# Patient Record
Sex: Female | Born: 1960 | Race: White | Hispanic: No | Smoking: Former smoker
Health system: Southern US, Community
[De-identification: ages and names within clinical notes are randomized; demographics above are authoritative.]

## PROBLEM LIST (undated history)

## (undated) DIAGNOSIS — F32A Depression, unspecified: Secondary | ICD-10-CM

## (undated) DIAGNOSIS — K219 Gastro-esophageal reflux disease without esophagitis: Secondary | ICD-10-CM

## (undated) DIAGNOSIS — F329 Major depressive disorder, single episode, unspecified: Secondary | ICD-10-CM

## (undated) DIAGNOSIS — M51369 Other intervertebral disc degeneration, lumbar region without mention of lumbar back pain or lower extremity pain: Secondary | ICD-10-CM

## (undated) DIAGNOSIS — Z780 Asymptomatic menopausal state: Secondary | ICD-10-CM

## (undated) DIAGNOSIS — M8580 Other specified disorders of bone density and structure, unspecified site: Secondary | ICD-10-CM

## (undated) DIAGNOSIS — E039 Hypothyroidism, unspecified: Secondary | ICD-10-CM

## (undated) DIAGNOSIS — M5136 Other intervertebral disc degeneration, lumbar region: Secondary | ICD-10-CM

## (undated) DIAGNOSIS — D649 Anemia, unspecified: Secondary | ICD-10-CM

## (undated) DIAGNOSIS — T7840XA Allergy, unspecified, initial encounter: Secondary | ICD-10-CM

## (undated) DIAGNOSIS — F419 Anxiety disorder, unspecified: Secondary | ICD-10-CM

## (undated) HISTORY — DX: Other intervertebral disc degeneration, lumbar region without mention of lumbar back pain or lower extremity pain: M51.369

## (undated) HISTORY — DX: Major depressive disorder, single episode, unspecified: F32.9

## (undated) HISTORY — DX: Anemia, unspecified: D64.9

## (undated) HISTORY — DX: Anxiety disorder, unspecified: F41.9

## (undated) HISTORY — DX: Asymptomatic menopausal state: Z78.0

## (undated) HISTORY — DX: Gastro-esophageal reflux disease without esophagitis: K21.9

## (undated) HISTORY — DX: Hypothyroidism, unspecified: E03.9

## (undated) HISTORY — DX: Allergy, unspecified, initial encounter: T78.40XA

## (undated) HISTORY — DX: Other intervertebral disc degeneration, lumbar region: M51.36

## (undated) HISTORY — DX: Other specified disorders of bone density and structure, unspecified site: M85.80

## (undated) HISTORY — DX: Depression, unspecified: F32.A

## (undated) HISTORY — PX: BACK SURGERY: SHX140

## (undated) HISTORY — PX: ABDOMINAL HYSTERECTOMY: SHX81

---

## 1998-03-16 ENCOUNTER — Other Ambulatory Visit: Admission: RE | Admit: 1998-03-16 | Discharge: 1998-03-16 | Payer: Self-pay | Admitting: Obstetrics and Gynecology

## 1999-03-06 ENCOUNTER — Encounter: Admission: RE | Admit: 1999-03-06 | Discharge: 1999-03-08 | Payer: Self-pay

## 1999-03-28 ENCOUNTER — Other Ambulatory Visit: Admission: RE | Admit: 1999-03-28 | Discharge: 1999-03-28 | Payer: Self-pay | Admitting: Obstetrics and Gynecology

## 1999-07-21 ENCOUNTER — Ambulatory Visit (HOSPITAL_COMMUNITY): Admission: RE | Admit: 1999-07-21 | Discharge: 1999-07-21 | Payer: Self-pay | Admitting: Gastroenterology

## 2000-04-24 ENCOUNTER — Other Ambulatory Visit: Admission: RE | Admit: 2000-04-24 | Discharge: 2000-04-24 | Payer: Self-pay | Admitting: Family Medicine

## 2001-05-14 ENCOUNTER — Other Ambulatory Visit: Admission: RE | Admit: 2001-05-14 | Discharge: 2001-05-14 | Payer: Self-pay | Admitting: Family Medicine

## 2001-06-05 ENCOUNTER — Ambulatory Visit (HOSPITAL_BASED_OUTPATIENT_CLINIC_OR_DEPARTMENT_OTHER): Admission: RE | Admit: 2001-06-05 | Discharge: 2001-06-05 | Payer: Self-pay | Admitting: *Deleted

## 2001-12-23 ENCOUNTER — Encounter (INDEPENDENT_AMBULATORY_CARE_PROVIDER_SITE_OTHER): Payer: Self-pay | Admitting: Specialist

## 2001-12-23 ENCOUNTER — Inpatient Hospital Stay (HOSPITAL_COMMUNITY): Admission: RE | Admit: 2001-12-23 | Discharge: 2001-12-26 | Payer: Self-pay | Admitting: Obstetrics and Gynecology

## 2004-01-04 ENCOUNTER — Other Ambulatory Visit: Admission: RE | Admit: 2004-01-04 | Discharge: 2004-01-04 | Payer: Self-pay | Admitting: Obstetrics and Gynecology

## 2004-03-21 ENCOUNTER — Encounter: Admission: RE | Admit: 2004-03-21 | Discharge: 2004-03-21 | Payer: Self-pay | Admitting: Orthopedic Surgery

## 2006-06-24 ENCOUNTER — Ambulatory Visit (HOSPITAL_COMMUNITY): Admission: RE | Admit: 2006-06-24 | Discharge: 2006-06-25 | Payer: Self-pay | Admitting: Orthopedic Surgery

## 2006-09-14 ENCOUNTER — Emergency Department (HOSPITAL_COMMUNITY): Admission: EM | Admit: 2006-09-14 | Discharge: 2006-09-14 | Payer: Self-pay | Admitting: Emergency Medicine

## 2006-10-17 ENCOUNTER — Ambulatory Visit (HOSPITAL_COMMUNITY): Admission: RE | Admit: 2006-10-17 | Discharge: 2006-10-18 | Payer: Self-pay | Admitting: Orthopedic Surgery

## 2007-08-12 ENCOUNTER — Inpatient Hospital Stay (HOSPITAL_COMMUNITY): Admission: RE | Admit: 2007-08-12 | Discharge: 2007-08-16 | Payer: Self-pay | Admitting: Neurosurgery

## 2011-01-02 NOTE — Discharge Summary (Signed)
NAMEAVIGAYIL, TON               ACCOUNT NO.:  [id number]   MEDICAL RECORD NO.:  [id number]          PATIENT TYPE:  INP   LOCATION:  3007                         FACILITY:  [facility]   PHYSICIAN:  Coletta Memos, M.D.     DATE OF BIRTH:  [birth year]   DATE OF ADMISSION:  08/12/2007  DATE OF DISCHARGE:  08/16/2007                               DISCHARGE SUMMARY   ADMITTING DIAGNOSES:  1. 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  2. Spondylosis L5-S1.   DISCHARGE DIAGNOSES:  1. 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  2. Spondylosis L5-S1.   PROCEDURE:  L4-5, L5-S1 laminectomy, re-do diskectomy, posterior lumbar  interbody fusion with Ray threaded cages and segmental pedicle screw  fixation and posterolateral arthrodesis performed August 12, 2007.   POSTOPERATIVE COURSE:  Has been uneventful.  Ms. Larzelere is up, has been  ambulating, has been voiding and tolerating a regular diet.  Her wound  is clean, dry and there are no signs of infection.  She will be  discharged home with a rolling walker 5 inch wheels and a 3 in 1.  These  needs have been taken care of.  She will go home today with a  prescription for Neurontin.  She will take Demerol as she has had  previously prescribed at home along with Flexeril.  She knows to call  Dr. Temple Pacini secretary, Cletis Media, for return appointment.  She was  given instructions per Dr. Temple Pacini printed sheet for discharge.  No  driving for 3-4 weeks.           ______________________________  Coletta Memos, M.D.     KC/MEDQ  D:  08/16/2007  T:  08/17/2007  Job:  [id number]

## 2011-01-02 NOTE — H&P (Signed)
NAME:  PARNEET, GLANTZ NO.:  [id number]   MEDICAL RECORD NO.:  [id number]          PATIENT TYPE:  INP   LOCATION:  3172                         FACILITY:  [facility]   PHYSICIAN:  Payton Doughty, M.D.      DATE OF BIRTH:  11-11-1960   DATE OF ADMISSION:  08/12/2007  DATE OF DISCHARGE:                              HISTORY & PHYSICAL   ADMISSION DIAGNOSES:  1. Recurrent disk L4-5.  2. Spondylosis L5-S1.   HISTORY OF PRESENT ILLNESS:  A very nice 50 year old left-handed white  woman who has had two diskectomies in the past year, one in November of  2007 and one in February of 2008.  She has increased pain in her left  leg.  MR shows a disk recurrence at L4-5.  Her operations were done by a  different doctor.  She states the pain in her back gets down to her left  leg.  She has undergone conservative management with no improvement and  now presents for a fusion.   PAST MEDICAL HISTORY:  Benign.   MEDICATIONS:  Zoloft, Elavil, Premarin, TriCor, Flexeril, Mobic,  hydrocodone, Aciphex, and vitamin D.   ALLERGIES:  SULFA, CODEINE.   SOCIAL HISTORY:  She does not smoke or drink.  She is a prescription  Development worker, international aid at [employer].   FAMILY HISTORY:  Her mother is 56 in good health with hypertension.  Her  father is 42 in fair health with diabetes and arthritis.   REVIEW OF SYSTEMS:  Remarkable for glasses, sinus problems, sinus  headache, leg pain, gastritis, abdominal pain, leg weakness, back pain,  and depression.   PHYSICAL EXAMINATION:  HEENT:  Within normal limits.  She has reasonable  range of motion in her neck.  CHEST:  Clear.  HEART:  Regular rate and rhythm.  ABDOMEN:  Nontender with no hepatosplenomegaly.  EXTREMITIES:  Without clubbing or cyanosis.  Peripheral pulses are good.  GENITOURINARY:  Deferred.  NEUROLOGY:  She is awake, alert and oriented.  Cranial nerves II-XII  grossly intact.  Motor examination shows 5/5  strength throughout the  upper and lower extremities.  No current sensory deficit.  Reflexes are  2 at the knees and 2 at the ankles.  Toes are downgoing bilaterally.  Straight leg raising is mildly positive on the left.   MR shows spondylosis and recurrent disk at L4-5.   IMPRESSION:  Disk recurrence and spondylosis with intractable back and  leg pain.   PLAN:  Lumbar laminectomy and diskectomy, posterior lumbar interbody  fusion with Ray threaded fusion cages, segmental pedicle screw fixation  with pedicle screws and posterolateral arthrodesis.  The risks and  benefits have been discussed with her and she wishes to proceed.           ______________________________  Payton Doughty, M.D.     MWR/MEDQ  D:  08/12/2007  T:  08/12/2007  Job:  [id number]

## 2011-01-02 NOTE — Op Note (Signed)
NAME:  Kelly Conley, Kelly Conley               ACCOUNT NO.:  [id number]   MEDICAL RECORD NO.:  [id number]          PATIENT TYPE:  INP   LOCATION:  3172                         FACILITY:  [facility]   PHYSICIAN:  Payton Doughty, M.D.      DATE OF BIRTH:  06-02-1961   DATE OF PROCEDURE:  08/12/2007  DATE OF DISCHARGE:                               OPERATIVE REPORT   PREOPERATIVE DIAGNOSIS:  Recurrent herniated disk L4-5, spondylosis L5-  S1.   POSTOPERATIVE DIAGNOSIS:  Recurrent herniated disk L4-5, spondylosis L5-  S1.   PROCEDURES:  L4-5, L5-S1 laminectomy, diskectomy, posterior lumbar  interbody fusion with Ray threaded fusion cages, segmental pedicle screw  fixation and posterolateral arthrodesis.   SURGEON:  Payton Doughty, M.D.   SERVICE:  Neurosurgery.   ANESTHESIA:  Endotracheal.   PREPARATION:  Prepped with alcohol wipe.   COMPLICATIONS:  None.   NURSE ASSISTANCE:  Covington.   DOCTOR ASSISTANCE:  Phoebe Perch.   This is a 50 year old girl whose had two prior diskectomies in the left  side at L4-5 who has a recurrent herniated disk there and spondylosis at  L5-S1 taken to the operating room, smoothly anesthetized and intubated,  placed prone on the operating table following shave, prep, and drape in  the usual sterile fashion. The old skin incision was reopened and it  extended from the mid L3 to mid S1. The remaining lamina of L4, the  lamina of L5 and the transverse process of L4, L5 and the sacral ala  were exposed bilaterally in the subperiosteal plane.  Intraoperative x-  ray confirmed correct level.  Having confirmed correct level, the pars  reticularis, remaining lamina and inferior facet of L4 and L5 and the  superior facet of L5 and S1 were removed using a high-speed drill and a  [employer] instrumentation. On the left side at L5-S1, there was a small  dural defect noted.  It was closed with 6-0 Vicryl in a watertight  fashion. Following complete removal of the facets, the ligamentum  flavum  was removed and this allowed dissection of the L3, L4, L5 and S1 nerve  roots. On the left side, there was recurrent disk that was elevating the  left L5 root and this was removed without difficulty. On the right side,  the L5 root was conjoined and laid directly across the L5-S1 interspace  and that could not be entered safely.  Once complete diskectomies had  been carried out at each level at L4-5 and on the left side at L5-S1,  Ray threaded fusion cages were placed. On the right side a 12 x 21 mm  cage was placed at L4-5, on the left side at L5-S1 a 12 x 21 mm cage was  placed and on the left side at L4-5 or because of scarring from the  prior operations access to the disk space was somewhat limited, an 8-mm  PEEK cage was placed after previously being packed with bone.  The Ray  cages that were implanted were packed with bone harvested from the facet  joints.  Pedicle screws were then placed in  L3-L4, L5-S1 using the  standard landmarks and x-rays showed good placement of the screws.  They  were connected with rods. The transverse  processes were decorticated with a high-speed drill and packed with BMP  on the extender matrix. The wound was irrigated and hemostasis assured.  Successive layers of #0 Vicryl, 2-0 Vicryl and 3-0 nylon were used to  close.  Betadine Telfa dressing was applied.  The patient returned to  the recovery room in good condition.           ______________________________  Payton Doughty, M.D.     MWR/MEDQ  D:  08/12/2007  T:  08/12/2007  Job:  [id number]

## 2011-01-05 NOTE — Op Note (Signed)
NAME:  Kelly Conley, Kelly Conley               ACCOUNT NO.:  [id number]   MEDICAL RECORD NO.:  [id number]          PATIENT TYPE:  AMB   LOCATION:  DAY                          FACILITY[facility]  [facility]   PHYSICIAN:  Georges Lynch. Gioffre, M.D.DATE OF BIRTH:  1961/01/04   DATE OF PROCEDURE:  DATE OF DISCHARGE:                                 OPERATIVE REPORT   SURGEON:  Ronald A. Darrelyn Hillock, M.D.   ASSISTANT:  Dr. Jene Every, MD   PREOPERATIVE DIAGNOSIS:  Central herniated lumbar disk with both right and  left leg pain, left greater the right.   POSTOPERATIVE DIAGNOSIS:  Central herniated lumbar disk with both right and  left leg pain, left greater the right.   OPERATION:  1. Bilateral hemilaminectomy at L5-S1.  2. Bilateral microdiskectomy at L5-S1.   PROCEDURE:  Under general anesthesia the patient on spinal frame, routine  orthopedic prep and draping the lower back carried out.  Two needles were  placed in the back for localization purposes.  X-ray was taken.  At this  time an incision was made over the L5-S1 interspace.  Bleeders identified  and cauterized.  Self-retaining retractors were inserted.  I then separated  the muscle from the overlying lamina and spinous process.  I identified the  L5-S1 interspace, went down and did bilateral hemilaminectomies by use of  the microscope and we then removed the ligamentum flavum bilaterally as well  at L5-S1.  Following that we went down first on the right side and then on  the left, cauterized lateral recess veins were bipolar, after we isolated  the S1 roots.  Cruciate incisions then were made in the posterior  longitudinal ligaments bilaterally and a complete diskectomy was carried  out.  We made multiple passes into the disk space.  We compressed the  subligamentous disk material into the space first by use of the Epstein  curettes.  We then went down and completed our diskectomy.  Another x-ray  was taken to verify the position.  Following that we  went down and made sure  the roots now were free.  We easily removed the nerve roots bilaterally  without any obstruction and we now had good decompression.  We thoroughly  irrigated out the area, removed the fluid, then loosely applied some  thrombin-soaked Gelfoam.  I then closed the wound in layers in usual  fashion.  The proximal deep portion of the wound, a portion of that was left  open for drainage purposes.  Subcu was closed with 0 Vicryl, skin with metal  staples.  A sterile Neosporin dressing was applied.  She had 1 gram of IV  Ancef preop.  She had Toradol 30 mg IV prior to leaving surgery.           ______________________________  Georges Lynch Darrelyn Hillock, M.D.     RAG/MEDQ  D:  06/24/2006  T:  06/25/2006  Job:  [id number]

## 2011-01-05 NOTE — Op Note (Signed)
[facility]  Patient:    Kelly Conley, Kelly Conley Visit Number: [id number] MRN: [id number]          Service Type: GYN Location: 9300 [id number] Attending Physician:  Miguel Aschoff Dictated by:   Miguel Aschoff, M.D. Proc. Date: 12/23/01 Admit Date:  12/23/2001                             Operative Report  PREOPERATIVE DIAGNOSES:       Dysmenorrhea, pelvic pain, dyspareunia.  POSTOPERATIVE DIAGNOSES:      Dysmenorrhea, pelvic pain, dyspareunia, left peritubal cyst.  SURGEON:                      Miguel Aschoff, M.D.  ASSISTANT:                    Luvenia Redden, M.D.  ANESTHESIA:                   General.  COMPLICATIONS:                None.  PROCEDURE:                    Total abdominal hysterectomy, removal of left paraovarian cyst.  BRIEF HISTORY:                The patient is a 50 year old white female with history of persistent and progressive dysmenorrhea, pelvic pain, and dyspareunia.  Patient was previously diagnosed in 1992 as having endometriosis via laparoscopy.  She has been tried on a variety of medications.  These have not given her satisfactory relief of her pain.  She presents now for definitive therapy via total abdominal hysterectomy and possible bilateral salpingo-oophorectomy for her dysmenorrhea, dyspareunia, and pelvic pain. Risks, benefits of this procedure were discussed with the patient.  PROCEDURE:                    Patient was taken to the operating room, placed in a supine position, and general anesthesia was administered without difficulty.  She was then prepped and draped in the usual sterile fashion.  A Foley catheter was inserted without difficulty.  Once this was done a Pfannenstiel incision was made, extended down through the subcutaneous tissue with bleeding points being clamped and coagulated as they were encountered. The fascia was then identified and incised transversely.  It was then separated from the underlying  rectus muscles.  Rectus muscles were divided in the midline.  The peritoneum was then revealed and entered, careful avoiding underlying structures.  Peritoneal incision was extended under direct visualization.  At this point a self retaining retractor was placed through the wound.  The viscera packed out of the pelvis.  The intestinal surfaces appeared to be unremarkable.  ______ appeared to be normal size and shape, filled the cul-de-sac.  The tubes were inspected.  There was evidence of a prior tubal sterilization.  The distal tubes were within normal limits.  The ovaries appeared to be within normal limits.  A small follicle cyst was noted on the left ovary and there was also a peritubal cyst noted on the left cyst. The peritubal cyst was on a long stalk and was approximately 2 cm in size. The cul-de-sac was unremarkable.  No definite implants of endometriosis were noted at this time.  At this point the round ligaments were identified, clamped, cut,  and suture ligated using suture ligatures of 0 Vicryl.  The bladder flap was then created anteriorly.  Perforations were then made below the utero-ovarian ligaments.  These were clamped with curved Heaney clamps. The pedicles were cut and then ligated using two ligatures of 0 plain Vicryl. After this was done the broad ligament was skeletonized.  Uterine vessels were identified, clamped with curved Heaney clamps.  These pedicles were cut and suture ligated using 0 Vicryl suture ligatures.  Serial bites were then taken of the paracervical fascia using straight Heaney clamps.  All the pedicles were suture ligated using 0 Vicryl suture ligatures.  Then the uterosacral ligaments and cardinal ligaments were clamped in a serial fashion.  All pedicles, again, were clamped, cut, and suture ligated using suture ligatures of 0 Vicryl.  Curved Heaney clamps were then placed across the reflection of the vagina onto the cervix.  The specimen was then  excised which included the cervix and uterus.  The angles of the vaginal cuff were then ligated using figure-of-eight sutures of 0 Vicryl.  The uterosacral ligaments were incorporated into these pedicles for support.  The cuff was then closed using interrupted 0 Vicryl suture.  Once this was done inspection was made for hemostasis.  Hemostasis appeared to be excellent and the pelvic floor was reperitonealized using running continuous 2-0 Vicryl suture.  All pedicles were then placed in retroperitoneal positions.  At this point lap counts were taken, found to be correct.  The abdomen was closed.  The parietoperitoneum was closed using running continuous 0 Vicryl suture.  Rectus muscles were reapproximated using running continuous 0 Vicryl suture.  The fascia was closed using two sutures of 0 Vicryl starting at the lateral fascial angles and meeting in the midline.  Subcutaneous tissue and skin were closed using staples.  The estimated blood loss was approximately 150 cc.  The patient tolerated the procedure well. Dictated by:   Miguel Aschoff, M.D. Attending Physician:  Miguel Aschoff DD:  12/23/01 TD:  12/24/01 Job: [id number]

## 2011-01-05 NOTE — Discharge Summary (Signed)
[facility]  Patient:    Kelly, Conley Visit Number: [id number] MRN: [id number]          Service Type: GYN Location: 9300 [id number] Attending Physician:  Miguel Aschoff Dictated by:   Miguel Aschoff, M.D. Admit Date:  12/23/2001 Discharge Date: 12/26/2001                             Discharge Summary  ADMISSION DIAGNOSES:             1. Chronic pelvic pain.                                  2. Dysmenorrhea.  FINAL DIAGNOSES:                 1. Chronic pelvic pain.                                  2. Dysmenorrhea.  OPERATIONS AND PROCEDURES:       Total abdominal hysterectomy.  COMPLICATIONS:                   None.  JUSTIFICATION:                   The patient is a 50 year old white female with a history of progressive dysmenorrhea and pelvic pain that has not responded to outpatient therapy.  The patient was previously diagnosed as having endometriosis and had laparoscopy in 1992.  Because of the persistence of her symptoms, she requested that definitive procedure be carried out to alleviate the dysmenorrhea and pelvic pain.  She was therefore admitted into the hospital at this time to undergo total abdominal hysterectomy.  She had requested conservation of the ovaries.  HOSPITAL COURSE:                 Preoperative studies were obtained.  This revealed initial hemoglobin of 11.4, hematocrit 35.3, white count of 5500; PT and PTT within normal limits as was the chemistry profile.  Dec 23, 2001, under general anesthesia, total abdominal hysterectomy was carried out without difficulty.  There was a left paraovarian cyst noted; the other intraoperative findings were essentially unremarkable.  The patient had an uneventful postoperative course.  She tolerated increasing ambulation and diet well.  Her hemoglobin did drop to 9.1 and stabilized.  By the third postoperative day, she was in satisfactory condition and could be sent home.  MEDICATIONS FOR  HOME:            Toradol one every 4 hours as needed for pain.  DISCHARGE ACTIVITY:              She was instructed to do no heavy lifting and place nothing in the vagina.  DISCHARGE INSTRUCTIONS:          To call for any problems such as fever, pain, or heavy bleeding.  FINAL PATHOLOGY:                 Final pathology report revealed cervicitis, squamous metaplasia, secretory endometrium, benign myometrium. Dictated by:   Miguel Aschoff, M.D. Attending Physician:  Miguel Aschoff DD:  01/17/02 TD:  01/19/02 Job: [id number]

## 2011-01-05 NOTE — Op Note (Signed)
[facility]  Patient:    [name], [name] Visit Number: [id number] MRN: [id number]          Service Type: DSU Location: [facility] Attending Physician:  Claudina Lick Dictated by:   Doris Cheadle. Lyman Bishop, M.D. Proc. Date: 06/05/01 Admit Date:  06/05/2001                             Operative Report  PREOPERATIVE DIAGNOSIS: 1. Deviated nasal septum. 2. Nasal turbinate hypertrophy.  POSTOPERATIVE DIAGNOSIS: 1. Deviated nasal septum. 2. Nasal turbinate hypertrophy.  OPERATION PERFORMED: 1. Nasal septoplasty. 2. Submucous resection of right inferior nasal turbinate.  SURGEON:  Robert L. Lyman Bishop, M.D.  ANESTHESIA:  General.  DESCRIPTION OF PROCEDURE:  This patient has had a longstanding history of chronic nasal obstruction, headaches.  CT scan of sinuses within normal limits.  Examination showed a marked septal deviation to the left with compensatory enlargement of the right inferior nasal turbinate.  The patient admitted for surgery.  After satisfactory general endotracheal anesthesia had been induced.  Topical epinephrine packs were placed intranasally after which the nasal septum and the right inferior nasal turbinate were infiltrated for hemostasis.  Nose and face were then prepped with Betadine and sterile drapes applied.  Examination showed a displacement of the nasal septum to the left involving both the cartilage and the perpendicular plate with a vomerine spur as well posteriorly to the left with compensatory enlargement of the right inferior nasal turbinate.  A left anterior septal incision was made.  The mucoperichondrium and periosteum elevated initially on the left side.  The cartilage was then partially separated from the bony septum and the deviated portion of the perpendicular plate which was also quite thickened superiorly, was removed with a Jansen-Middleton bone-biting forceps.  A small spur was then removed from  the left side of the maxillary crest with a chisel and the vomerine spur was also removed.  The cartilage which had been dissected up from the maxillary crest was repositioned in the midline where it was stabilized with a mattress suture of 4-0 plain gut placed in the [facility] technique after which the posterior septal flaps were reapproximated with a mattress suture of 4-0 plain gut.  Relaxing incision was made in the left flap near the floor and the septal incision closed with running 5-0 chromic gut.  A small incision was made over the anterior end of the left inferior nasal turbinate. Mucoperiosteum elevated.  Some excess turbinate bone removed along with some excess mucosa from the incision which was then closed with interrupted 5-0 chromic gut.  A folded strip of Telfa gauze coated with Cortisporin ointment was then placed in each side of the nasal cavity.  Estimated blood loss was less than 5 cc.  The patient tolerated the procedure well, was awakened from anesthesia and taken to the recovery room in satisfactory condition.  The patient is to be seen in the office in one day for removal of nasal packs. Dictated by:   Doris Cheadle. Lyman Bishop, M.D. Attending Physician:  Claudina Lick DD:  06/05/01 TD:  06/05/01 Job: [id number]

## 2011-01-05 NOTE — Op Note (Signed)
NAME:  Kelly Conley, Kelly Conley               ACCOUNT NO.:  [id number]   MEDICAL RECORD NO.:  [id number]          PATIENT TYPE:  AMB   LOCATION:  DAY                          FACILITY:  [facility]   PHYSICIAN:  Georges Lynch. Gioffre, M.D.DATE OF BIRTH:  1960/12/08   DATE OF PROCEDURE:  10/17/2006  DATE OF DISCHARGE:                               OPERATIVE REPORT   SURGEON:  Georges Lynch. Darrelyn Hillock, M.D.   ASSISTANT:  Jene Every, M.d.   PREOPERATIVE DIAGNOSES:  1. Spinal stenosis L4-5.  2. 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   POSTOPERATIVE DIAGNOSES:  1. Spinal stenosis L4-5.  2. 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   OPERATION:  1. Central decompressive lumbar laminectomy at L4-5.  2. Bilateral microdiskectomy at L4-5.   PROCEDURE:  Under general anesthesia the patient on spinal frame,  routine orthopedic prep and drape of the back carried out.  Two needles  were placed in the back for localization purposes.  X-ray was taken to  verify the L4-5 space.  The patient had 1 gram of IV Ancef.  Following  that we then went down did a incision over the L4-5 space.  Bleeders  identified and cauterized.  The self-retaining retractors were inserted.  I then went down separated the muscle from the lamina and spinous  process of L4-5 region.  Another x-ray was taken to verify our position.  I then went down did a central decompressive lumbar laminectomy.  The  ligamentum flavum was quite thickened in this area.  We removed the  ligamentum flavum with great care not to injure the dura.  The  microscope was brought in.  I then cauterized lateral recess veins after  gently retracting the dura with the D'Errico retractor.  We utilized a  bipolar cautery.  Another x-ray was taken to verify the L4-5 space at  this time.  We then went down distally and on both sides and  made sure  there wre no fragments distal.  We identified the disk first in the left  side where she was most symptomatic, made a cruciate incision in the  posterior longitudinal ligament.  She had a large extruded piece of disk  material that actually migrated distally as well.  Thoroughly removed  that and went down made multiple passes, went out and examined the nerve  as it went out the foramen and decompressed the nerve distally as well.  We checked to make sure there were no other fragments that went more  distal.  There were not.  We then went on the other side, did the same,  there was a small disk rupture on the opposite side.  We went across the  midline and removed all the disk that was present.  We decompressed the  subligamentous disk material into the space and then with pituitary  rongeurs went in and cleaned out the disk.  Both nerve roots were  nicely  decompressed.  We had nice foraminotomies.  The dura now was free.  I  thoroughly irrigated out the area and loosely applied some thrombin-  soaked Gelfoam.  The wound then was closed in layers in the usual  fashion.  I left the deep proximal part of the  wound open for drainage purposes.  I injected 30 mL 0.5% Marcaine with  epinephrine in the wound site.  The remaining part of the skin was  closed the usual fashion.  Skin was closed metal staples.  Sterile  dressings were applied.  The patient had 1 gram of IV Ancef preop.           ______________________________  Georges Lynch. Darrelyn Hillock, M.D.     RAG/MEDQ  D:  10/17/2006  T:  10/17/2006  Job:  [id number]

## 2011-05-25 LAB — CBC
MCHC: 33
MCV: 84.7
Platelets: 362

## 2011-05-25 LAB — COMPREHENSIVE METABOLIC PANEL
AST: 25
Albumin: 4.4
CO2: 28
Calcium: 9.9
Creatinine, Ser: 0.76
GFR calc Af Amer: >60
GFR calc non Af Amer: >60

## 2011-05-25 LAB — TYPE AND SCREEN: ABO/RH(D): O POS

## 2011-05-25 LAB — URINALYSIS, ROUTINE W REFLEX MICROSCOPIC
Glucose, UA: NEGATIVE
Nitrite: NEGATIVE
Protein, ur: NEGATIVE
Urobilinogen, UA: 0.2

## 2011-05-25 LAB — DIFFERENTIAL
Eosinophils Relative: 1
Lymphocytes Relative: 41
Lymphs Abs: 2.5

## 2011-05-25 LAB — PROTIME-INR
INR: 0.9
Prothrombin Time: 12.6

## 2011-05-25 LAB — APTT: aPTT: 25

## 2011-08-21 DIAGNOSIS — N959 Unspecified menopausal and perimenopausal disorder: Secondary | ICD-10-CM

## 2011-08-21 HISTORY — DX: Unspecified menopausal and perimenopausal disorder: N95.9

## 2012-07-09 ENCOUNTER — Ambulatory Visit (HOSPITAL_BASED_OUTPATIENT_CLINIC_OR_DEPARTMENT_OTHER): Admission: RE | Admit: 2012-07-09 | Payer: Self-pay | Source: Ambulatory Visit | Admitting: Otolaryngology

## 2012-07-09 ENCOUNTER — Encounter (HOSPITAL_BASED_OUTPATIENT_CLINIC_OR_DEPARTMENT_OTHER): Admission: RE | Payer: Self-pay | Source: Ambulatory Visit

## 2012-07-09 SURGERY — SEPTOPLASTY, NOSE, WITH NASAL TURBINATE REDUCTION
Anesthesia: General | Laterality: Bilateral

## 2012-11-07 ENCOUNTER — Other Ambulatory Visit: Payer: Self-pay | Admitting: *Deleted

## 2012-11-07 MED ORDER — ALPRAZOLAM 0.5 MG PO TBDP: 0.5000 mg | ORAL_TABLET | Freq: Two times a day (BID) | ORAL | Status: DC | PRN

## 2012-11-07 NOTE — Telephone Encounter (Signed)
Picked up by patient

## 2012-11-10 ENCOUNTER — Telehealth: Payer: Self-pay | Admitting: *Deleted

## 2012-11-10 NOTE — Telephone Encounter (Signed)
Discussed with Helene Kelp, PA-C and he said to fill the 30 and schedule appt.  Discussed with patient and she will call back for an appt.

## 2012-11-10 NOTE — Telephone Encounter (Signed)
Pharmacy called stating patient normally gets 60 tabs of Xanax and was prescribed 30 on most recent refill.  Did you intend to refill it for 30 or can you change it to 60?

## 2012-11-10 NOTE — Telephone Encounter (Signed)
Patient called back to check on prescription.  Stated that she would schedule an appt if necessary.  Gets off work at 5:00 today.  Would like for [place] to let her know something before then so that she can go by the pharmacy on the way home.

## 2012-11-20 ENCOUNTER — Encounter: Payer: Self-pay | Admitting: General Practice

## 2012-11-20 ENCOUNTER — Ambulatory Visit (INDEPENDENT_AMBULATORY_CARE_PROVIDER_SITE_OTHER): Payer: BC Managed Care – PPO | Admitting: General Practice

## 2012-11-20 ENCOUNTER — Ambulatory Visit: Payer: Self-pay | Admitting: General Practice

## 2012-11-20 VITALS — BP 115/78 | HR 86 | Temp 98.0°F | Ht 67.0 in | Wt 178.0 lb

## 2012-11-20 DIAGNOSIS — E785 Hyperlipidemia, unspecified: Secondary | ICD-10-CM

## 2012-11-20 LAB — POCT CBC
Granulocyte percent: 53.9 %G (ref 37–80)
HCT, POC: 35.6 % — AB (ref 37.7–47.9)
Hemoglobin: 11.8 g/dL — AB (ref 12.2–16.2)
POC Granulocyte: 3.6 (ref 2–6.9)
RBC: 4.3 M/uL (ref 4.04–5.48)

## 2012-11-20 LAB — COMPLETE METABOLIC PANEL WITH GFR
ALT: 19 U/L (ref 0–35)
Albumin: 4.9 g/dL (ref 3.5–5.2)
CO2: 29 mEq/L (ref 19–32)
GFR, Est African American: 83 mL/min
GFR, Est Non African American: 72 mL/min
Glucose, Bld: 82 mg/dL (ref 70–99)
Potassium: 4.3 mEq/L (ref 3.5–5.3)
Sodium: 141 mEq/L (ref 135–145)
Total Protein: 6.7 g/dL (ref 6.0–8.3)

## 2012-11-20 MED ORDER — FENOFIBRATE 145 MG PO TABS: 145.0000 mg | ORAL_TABLET | Freq: Every day | ORAL | Status: DC

## 2012-11-20 MED ORDER — AMITRIPTYLINE HCL 25 MG PO TABS: 25.0000 mg | ORAL_TABLET | Freq: Every day | ORAL | Status: DC

## 2012-11-20 MED ORDER — ALPRAZOLAM 0.5 MG PO TBDP: 0.5000 mg | ORAL_TABLET | Freq: Two times a day (BID) | ORAL | Status: DC | PRN

## 2012-11-20 NOTE — Patient Instructions (Addendum)
Anxiety and Panic Attacks Your caregiver has informed you that you are having an anxiety or panic attack. There may be many forms of this. Most of the time these attacks come suddenly and without warning. They come at any time of day, including periods of sleep, and at any time of life. They may be strong and unexplained. Although panic attacks are very scary, they are physically harmless. Sometimes the cause of your anxiety is not known. Anxiety is a protective mechanism of the body in its fight or flight mechanism. Most of these perceived danger situations are actually nonphysical situations (such as anxiety over losing a job). CAUSES  The causes of an anxiety or panic attack are many. Panic attacks may occur in otherwise healthy people given a certain set of circumstances. There may be a genetic cause for panic attacks. Some medications may also have anxiety as a side effect. SYMPTOMS  Some of the most common feelings are:  Intense terror.  Dizziness, feeling faint.  Hot and cold flashes.  Fear of going crazy.  Feelings that nothing is real.  Sweating.  Shaking.  Chest pain or a fast heartbeat (palpitations).  Smothering, choking sensations.  Feelings of impending doom and that death is near.  Tingling of extremities, this may be from over-breathing.  Altered reality (derealization).  Being detached from yourself (depersonalization). Several symptoms can be present to make up anxiety or panic attacks. DIAGNOSIS  The evaluation by your caregiver will depend on the type of symptoms you are experiencing. The diagnosis of anxiety or panic attack is made when no physical illness can be determined to be a cause of the symptoms. TREATMENT  Treatment to prevent anxiety and panic attacks may include:  Avoidance of circumstances that cause anxiety.  Reassurance and relaxation.  Regular exercise.  Relaxation therapies, such as yoga.  Psychotherapy with a psychiatrist or  therapist.  Avoidance of caffeine, alcohol and illegal drugs.  Prescribed medication. SEEK IMMEDIATE MEDICAL CARE IF:   You experience panic attack symptoms that are different than your usual symptoms.  You have any worsening or concerning symptoms. Document Released: 08/06/2005 Document Revised: 10/29/2011 Document Reviewed: 12/08/2009 [facility] Patient Information 2013 [place], [place]. Insomnia Insomnia is frequent trouble falling and/or staying asleep. Insomnia can be a long term problem or a short term problem. Both are common. Insomnia can be a short term problem when the wakefulness is related to a certain stress or worry. Long term insomnia is often related to ongoing stress during waking hours and/or poor sleeping habits. Overtime, sleep deprivation itself can make the problem worse. Every little thing feels more severe because you are overtired and your ability to cope is decreased. CAUSES   Stress, anxiety, and depression.  Poor sleeping habits.  Distractions such as TV in the bedroom.  Naps close to bedtime.  Engaging in emotionally charged conversations before bed.  Technical reading before sleep.  Alcohol and other sedatives. They may make the problem worse. They can hurt normal sleep patterns and normal dream activity.  Stimulants such as caffeine for several hours prior to bedtime.  Pain syndromes and shortness of breath can cause insomnia.  Exercise late at night.  Changing time zones may cause sleeping problems (jet lag). It is sometimes helpful to have someone observe your sleeping patterns. They should look for periods of not breathing during the night (sleep apnea). They should also look to see how long those periods last. If you live alone or observers are uncertain, you can also be  observed at a sleep clinic where your sleep patterns will be professionally monitored. Sleep apnea requires a checkup and treatment. Give your caregivers your medical history. Give  your caregivers observations your family has made about your sleep.  SYMPTOMS   Not feeling rested in the morning.  Anxiety and restlessness at bedtime.  Difficulty falling and staying asleep. TREATMENT   Your caregiver may prescribe treatment for an underlying medical disorders. Your caregiver can give advice or help if you are using alcohol or other drugs for self-medication. Treatment of underlying problems will usually eliminate insomnia problems.  Medications can be prescribed for short time use. They are generally not recommended for lengthy use.  Over-the-counter sleep medicines are not recommended for lengthy use. They can be habit forming.  You can promote easier sleeping by making lifestyle changes such as:  Using relaxation techniques that help with breathing and reduce muscle tension.  Exercising earlier in the day.  Changing your diet and the time of your last meal. No night time snacks.  Establish a regular time to go to bed.  Counseling can help with stressful problems and worry.  Soothing music and white noise may be helpful if there are background noises you cannot remove.  Stop tedious detailed work at least one hour before bedtime. HOME CARE INSTRUCTIONS   Keep a diary. Inform your caregiver about your progress. This includes any medication side effects. See your caregiver regularly. Take note of:  Times when you are asleep.  Times when you are awake during the night.  The quality of your sleep.  How you feel the next day. This information will help your caregiver care for you.  Get out of bed if you are still awake after 15 minutes. Read or do some quiet activity. Keep the lights down. Wait until you feel sleepy and go back to bed.  Keep regular sleeping and waking hours. Avoid naps.  Exercise regularly.  Avoid distractions at bedtime. Distractions include watching television or engaging in any intense or detailed activity like attempting to  balance the household checkbook.  Develop a bedtime ritual. Keep a familiar routine of bathing, brushing your teeth, climbing into bed at the same time each night, listening to soothing music. Routines increase the success of falling to sleep faster.  Use relaxation techniques. This can be using breathing and muscle tension release routines. It can also include visualizing peaceful scenes. You can also help control troubling or intruding thoughts by keeping your mind occupied with boring or repetitive thoughts like the old concept of counting sheep. You can make it more creative like imagining planting one beautiful flower after another in your backyard garden.  During your day, work to eliminate stress. When this is not possible use some of the previous suggestions to help reduce the anxiety that accompanies stressful situations. MAKE SURE YOU:   Understand these instructions.  Will watch your condition.  Will get help right away if you are not doing well or get worse. Document Released: 08/03/2000 Document Revised: 10/29/2011 Document Reviewed: 09/03/2007 [facility] Patient Information 2013 [place], [place].  Hypertriglyceridemia  Diet for High blood levels of Triglycerides Most fats in food are triglycerides. Triglycerides in your blood are stored as fat in your body. High levels of triglycerides in your blood may put you at a greater risk for heart disease and stroke.  Normal triglyceride levels are less than 150 mg/dL. Borderline high levels are 150-199 mg/dl. High levels are 200 - 499 mg/dL, and very high triglyceride  levels are greater than 500 mg/dL. The decision to treat high triglycerides is generally based on the level. For people with borderline or high triglyceride levels, treatment includes weight loss and exercise. Drugs are recommended for people with very high triglyceride levels. Many people who need treatment for high triglyceride levels have metabolic syndrome. This syndrome is  a collection of disorders that often include: insulin resistance, high blood pressure, blood clotting problems, high cholesterol and triglycerides. TESTING PROCEDURE FOR TRIGLYCERIDES  You should not eat 4 hours before getting your triglycerides measured. The normal range of triglycerides is between 10 and 250 milligrams per deciliter (mg/dl). Some people may have extreme levels (1000 or above), but your triglyceride level may be too high if it is above 150 mg/dl, depending on what other risk factors you have for heart disease.  People with high blood triglycerides may also have high blood cholesterol levels. If you have high blood cholesterol as well as high blood triglycerides, your risk for heart disease is probably greater than if you only had high triglycerides. High blood cholesterol is one of the main risk factors for heart disease. CHANGING YOUR DIET  Your weight can affect your blood triglyceride level. If you are more than 20% above your ideal body weight, you may be able to lower your blood triglycerides by losing weight. Eating less and exercising regularly is the best way to combat this. Fat provides more calories than any other food. The best way to lose weight is to eat less fat. Only 30% of your total calories should come from fat. Less than 7% of your diet should come from saturated fat. A diet low in fat and saturated fat is the same as a diet to decrease blood cholesterol. By eating a diet lower in fat, you may lose weight, lower your blood cholesterol, and lower your blood triglyceride level.  Eating a diet low in fat, especially saturated fat, may also help you lower your blood triglyceride level. Ask your dietitian to help you figure how much fat you can eat based on the number of calories your caregiver has prescribed for you.  Exercise, in addition to helping with weight loss may also help lower triglyceride levels.   Alcohol can increase blood triglycerides. You may need to stop  drinking alcoholic beverages.  Too much carbohydrate in your diet may also increase your blood triglycerides. Some complex carbohydrates are necessary in your diet. These may include bread, rice, potatoes, other starchy vegetables and cereals.  Reduce "simple" carbohydrates. These may include pure sugars, candy, honey, and jelly without losing other nutrients. If you have the kind of high blood triglycerides that is affected by the amount of carbohydrates in your diet, you will need to eat less sugar and less high-sugar foods. Your caregiver can help you with this.  Adding 2-4 grams of fish oil (EPA+ DHA) may also help lower triglycerides. Speak with your caregiver before adding any supplements to your regimen. Following the Diet  Maintain your ideal weight. Your caregivers can help you with a diet. Generally, eating less food and getting more exercise will help you lose weight. Joining a weight control group may also help. Ask your caregivers for a good weight control group in your area.  Eat low-fat foods instead of high-fat foods. This can help you lose weight too.  These foods are lower in fat. Eat MORE of these:   Dried beans, peas, and lentils.  Egg whites.  Low-fat cottage cheese.  Fish.  USAA  cuts of meat, such as round, sirloin, rump, and flank (cut extra fat off meat you fix).  Whole grain breads, cereals and pasta.  Skim and nonfat dry milk.  Low-fat yogurt.  Poultry without the skin.  Cheese made with skim or part-skim milk, such as mozzarella, parmesan, farmers', ricotta, or pot cheese. These are higher fat foods. Eat LESS of these:   Whole milk and foods made from whole milk, such as American, blue, cheddar, monterey jack, and swiss cheese  High-fat meats, such as luncheon meats, sausages, knockwurst, bratwurst, hot dogs, ribs, corned beef, ground pork, and regular ground beef.  Fried foods. Limit saturated fats in your diet. Substituting unsaturated fat for  saturated fat may decrease your blood triglyceride level. You will need to read package labels to know which products contain saturated fats.  These foods are high in saturated fat. Eat LESS of these:   Fried pork skins.  Whole milk.  Skin and fat from poultry.  Palm oil.  Butter.  Shortening.  Cream cheese.  Tomasa Blase.  Margarines and baked goods made from listed oils.  Vegetable shortenings.  Chitterlings.  Fat from meats.  Coconut oil.  Palm kernel oil.  Lard.  Cream.  Sour cream.  Fatback.  Coffee whiteners and non-dairy creamers made with these oils.  Cheese made from whole milk. Use unsaturated fats (both polyunsaturated and monounsaturated) moderately. Remember, even though unsaturated fats are better than saturated fats; you still want a diet low in total fat.  These foods are high in unsaturated fat:   Canola oil.  Sunflower oil.  Mayonnaise.  Almonds.  Peanuts.  Pine nuts.  Margarines made with these oils.  Safflower oil.  Olive oil.  Avocados.  Cashews.  Peanut butter.  Sunflower seeds.  Soybean oil.  Peanut oil.  Olives.  Pecans.  Walnuts.  Pumpkin seeds. Avoid sugar and other high-sugar foods. This will decrease carbohydrates without decreasing other nutrients. Sugar in your food goes rapidly to your blood. When there is excess sugar in your blood, your liver may use it to make more triglycerides. Sugar also contains calories without other important nutrients.  Eat LESS of these:   Sugar, brown sugar, powdered sugar, jam, jelly, preserves, honey, syrup, molasses, pies, candy, cakes, cookies, frosting, pastries, colas, soft drinks, punches, fruit drinks, and regular gelatin.  Avoid alcohol. Alcohol, even more than sugar, may increase blood triglycerides. In addition, alcohol is high in calories and low in nutrients. Ask for sparkling water, or a diet soft drink instead of an alcoholic beverage. Suggestions for planning  and preparing meals   Bake, broil, grill or roast meats instead of frying.  Remove fat from meats and skin from poultry before cooking.  Add spices, herbs, lemon juice or vinegar to vegetables instead of salt, rich sauces or gravies.  Use a non-stick skillet without fat or use no-stick sprays.  Cool and refrigerate stews and broth. Then remove the hardened fat floating on the surface before serving.  Refrigerate meat drippings and skim off fat to make low-fat gravies.  Serve more fish.  Use less butter, margarine and other high-fat spreads on bread or vegetables.  Use skim or reconstituted non-fat dry milk for cooking.  Cook with low-fat cheeses.  Substitute low-fat yogurt or cottage cheese for all or part of the sour cream in recipes for sauces, dips or congealed salads.  Use half yogurt/half mayonnaise in salad recipes.  Substitute evaporated skim milk for cream. Evaporated skim milk or reconstituted non-fat dry milk  can be whipped and substituted for whipped cream in certain recipes.  Choose fresh fruits for dessert instead of high-fat foods such as pies or cakes. Fruits are naturally low in fat. When Dining Out   Order low-fat appetizers such as fruit or vegetable juice, pasta with vegetables or tomato sauce.  Select clear, rather than cream soups.  Ask that dressings and gravies be served on the side. Then use less of them.  Order foods that are baked, broiled, poached, steamed, stir-fried, or roasted.  Ask for margarine instead of butter, and use only a small amount.  Drink sparkling water, unsweetened tea or coffee, or diet soft drinks instead of alcohol or other sweet beverages. QUESTIONS AND ANSWERS ABOUT OTHER FATS IN THE BLOOD: SATURATED FAT, TRANS FAT, AND CHOLESTEROL What is trans fat? Trans fat is a type of fat that is formed when vegetable oil is hardened through a process called hydrogenation. This process helps makes foods more solid, gives them shape, and  prolongs their shelf life. Trans fats are also called hydrogenated or partially hydrogenated oils.  What do saturated fat, trans fat, and cholesterol in foods have to do with heart disease? Saturated fat, trans fat, and cholesterol in the diet all raise the level of LDL "bad" cholesterol in the blood. The higher the LDL cholesterol, the greater the risk for coronary heart disease (CHD). Saturated fat and trans fat raise LDL similarly.  What foods contain saturated fat, trans fat, and cholesterol? High amounts of saturated fat are found in animal products, such as fatty cuts of meat, chicken skin, and full-fat dairy products like butter, whole milk, cream, and cheese, and in tropical vegetable oils such as palm, palm kernel, and coconut oil. Trans fat is found in some of the same foods as saturated fat, such as vegetable shortening, some margarines (especially hard or stick margarine), crackers, cookies, baked goods, fried foods, salad dressings, and other processed foods made with partially hydrogenated vegetable oils. Small amounts of trans fat also occur naturally in some animal products, such as milk products, beef, and lamb. Foods high in cholesterol include liver, other organ meats, egg yolks, shrimp, and full-fat dairy products. How can I use the new food label to make heart-healthy food choices? Check the Nutrition Facts panel of the food label. Choose foods lower in saturated fat, trans fat, and cholesterol. For saturated fat and cholesterol, you can also use the Percent Daily Value (%DV): 5% DV or less is low, and 20% DV or more is high. (There is no %DV for trans fat.) Use the Nutrition Facts panel to choose foods low in saturated fat and cholesterol, and if the trans fat is not listed, read the ingredients and limit products that list shortening or hydrogenated or partially hydrogenated vegetable oil, which tend to be high in trans fat. POINTS TO REMEMBER:   Discuss your risk for heart disease  with your caregivers, and take steps to reduce risk factors.  Change your diet. Choose foods that are low in saturated fat, trans fat, and cholesterol.  Add exercise to your daily routine if it is not already being done. Participate in physical activity of moderate intensity, like brisk walking, for at least 30 minutes on most, and preferably all days of the week. No time? Break the 30 minutes into three, 10-minute segments during the day.  Stop smoking. If you do smoke, contact your caregiver to discuss ways in which they can help you quit.  Do not use street drugs.  Maintain a normal weight.  Maintain a healthy blood pressure.  Keep up with your blood work for checking the fats in your blood as directed by your caregiver. Document Released: 05/24/2004 Document Revised: 02/05/2012 Document Reviewed: 12/20/2008 [facility] Patient Information 2013 [place], [place].

## 2012-11-20 NOTE — Progress Notes (Signed)
  Subjective:    Patient ID: Kelly Conley, female    DOB: [birth year], 52 y.o.   MRN: [id number]  Anxiety Presents for follow-up visit. Symptoms include nervous/anxious behavior and restlessness. Patient reports no dizziness, shortness of breath or suicidal ideas. Episode frequency: anxious constantly if not taking medications. The severity of symptoms is severe and causing significant distress. Hours of sleep per night: if taking medication. The quality of sleep is good. Nighttime awakenings: one to two (if medication nto taken).   Compliance with medications is 76-100%.  Patient verbalized that anxiety is well managed with medications. Calm and mood well controlled. Able to function well in work. Able to sleep 7-8 hours a night of uninterrupted sleep while taking medications. Reports eating healthy diet and exercising. Health screening (lipid panel) completed by work place [facility]).  Reports back pain is managed by Dr. Channing Mutters in [place]. Dr. Tenny Craw ([facility]) in [place] manages hormones.     Review of Systems  Respiratory: Negative for shortness of breath.   Neurological: Negative for dizziness.  Psychiatric/Behavioral: Negative for suicidal ideas. The patient is nervous/anxious.        Objective:   Physical Exam  Constitutional: She is oriented to person, place, and time. She appears well-developed and well-nourished.  HENT:  Head: Normocephalic and atraumatic.  Cardiovascular: Normal rate, regular rhythm, normal heart sounds and intact distal pulses.   No murmur heard. Pulmonary/Chest: Effort normal and breath sounds normal. No respiratory distress. She exhibits no tenderness.  Abdominal: Soft. Bowel sounds are normal. She exhibits no distension. There is no tenderness. There is no rebound.  Neurological: She is alert and oriented to person, place, and time.  Skin: Skin is warm and dry. No rash noted.  Psychiatric: She has a normal mood and affect.          Assessment & Plan:  Continue current medications Discussed healthy diet and exercise Discussed lipid panel results Labs pending (cmp and cbc) RTO if symptoms develop Patient verbalized understanding and denies questions  Raymon Mutton, FNP-C

## 2012-11-21 ENCOUNTER — Encounter: Payer: Self-pay | Admitting: *Deleted

## 2012-11-21 ENCOUNTER — Other Ambulatory Visit: Payer: Self-pay | Admitting: General Practice

## 2012-11-21 DIAGNOSIS — F411 Generalized anxiety disorder: Secondary | ICD-10-CM

## 2012-11-21 MED ORDER — ALPRAZOLAM 0.5 MG PO TBDP: 0.5000 mg | ORAL_TABLET | Freq: Two times a day (BID) | ORAL | Status: DC | PRN

## 2012-11-21 NOTE — Progress Notes (Signed)
Patient aware of labs.  

## 2012-11-28 ENCOUNTER — Encounter: Payer: Self-pay | Admitting: Family Medicine

## 2013-02-24 ENCOUNTER — Other Ambulatory Visit: Payer: Self-pay

## 2013-02-24 DIAGNOSIS — F411 Generalized anxiety disorder: Secondary | ICD-10-CM

## 2013-02-24 NOTE — Telephone Encounter (Signed)
Last seen 11/20/12  Last filled 11/21/12    If approved phone in and have nurse call patient to notify

## 2013-02-25 MED ORDER — ALPRAZOLAM 0.5 MG PO TABS: 0.5000 mg | ORAL_TABLET | Freq: Every evening | ORAL | Status: DC | PRN

## 2013-02-25 NOTE — Telephone Encounter (Signed)
Please call into pharmacy. thx 

## 2013-02-26 NOTE — Telephone Encounter (Signed)
RX called to [facility] vm. LM for pt.

## 2013-02-27 ENCOUNTER — Telehealth: Payer: Self-pay

## 2013-02-27 NOTE — Telephone Encounter (Signed)
Xanax called in as 1 qd and patient takes 1 BID to sleep   [facility]   Can you please correct this

## 2013-03-05 ENCOUNTER — Other Ambulatory Visit: Payer: Self-pay | Admitting: Obstetrics and Gynecology

## 2013-04-15 ENCOUNTER — Other Ambulatory Visit: Payer: Self-pay

## 2013-04-15 MED ORDER — ALPRAZOLAM 0.5 MG PO TABS: 0.5000 mg | ORAL_TABLET | Freq: Every evening | ORAL | Status: DC | PRN

## 2013-04-15 NOTE — Telephone Encounter (Signed)
Med called to pharm 

## 2013-04-15 NOTE — Telephone Encounter (Signed)
Last seen 11/20/12  Kelly Conley  Last filld 02/24/13   If approved route to nurse to call in

## 2013-04-15 NOTE — Telephone Encounter (Signed)
Please phone in

## 2013-05-12 ENCOUNTER — Other Ambulatory Visit: Payer: Self-pay

## 2013-05-12 MED ORDER — ALPRAZOLAM 0.5 MG PO TABS: 0.5000 mg | ORAL_TABLET | Freq: Two times a day (BID) | ORAL | Status: DC

## 2013-05-12 NOTE — Telephone Encounter (Signed)
Due 05/16/13  Last seen 11/20/12 Mae  Upcoming appt with you 05/22/13    If approved route to nurse to call into [facility]

## 2013-05-12 NOTE — Telephone Encounter (Signed)
Please call into pharmacy. thx 

## 2013-05-14 NOTE — Telephone Encounter (Signed)
Called in.

## 2013-05-22 ENCOUNTER — Ambulatory Visit (INDEPENDENT_AMBULATORY_CARE_PROVIDER_SITE_OTHER): Payer: BC Managed Care – PPO | Admitting: General Practice

## 2013-05-22 ENCOUNTER — Encounter: Payer: Self-pay | Admitting: General Practice

## 2013-05-22 VITALS — BP 134/87 | HR 77 | Temp 97.1°F | Ht 67.0 in | Wt 179.0 lb

## 2013-05-22 DIAGNOSIS — F329 Major depressive disorder, single episode, unspecified: Secondary | ICD-10-CM

## 2013-05-22 MED ORDER — PAROXETINE HCL 20 MG PO TABS: 20.0000 mg | ORAL_TABLET | ORAL | Status: DC

## 2013-05-22 NOTE — Progress Notes (Signed)
  Subjective:    Patient ID: Kelly Conley, female    DOB: 05/12/1961, 52 y.o.   MRN: [id number]  HPI Patient presents today to discuss depression. She reports being treated for depression in the past and is having some of the same symptoms. She reports mood swings, uncontrollable crying, lack of motivation, loss of energy, and trouble sleeping. She reports taking paxil in the past and found it effective.  She reports caring for a husband who is in remission from cancer, son relocated to [place], and her dog of 12 years passed away recently. She reports working a full time job. She denies thoughts of harming self or others.     Review of Systems  Constitutional: Negative for fever and chills.  Respiratory: Negative for chest tightness and shortness of breath.   Cardiovascular: Negative for chest pain.  Psychiatric/Behavioral: Positive for sleep disturbance. Negative for suicidal ideas and self-injury. The patient is nervous/anxious.        Mood swings       Objective:   Physical Exam  Constitutional: She is oriented to person, place, and time. She appears well-developed and well-nourished.  HENT:  Head: Normocephalic and atraumatic.  Right Ear: External ear normal.  Left Ear: External ear normal.  Nose: Nose normal.  Mouth/Throat: Oropharynx is clear and moist.  Eyes: EOM are normal. Pupils are equal, round, and reactive to light.  Neck: Normal range of motion. Neck supple. No thyromegaly present.  Cardiovascular: Normal rate, regular rhythm and normal heart sounds.   Pulmonary/Chest: Effort normal and breath sounds normal. No respiratory distress. She exhibits no tenderness.  Abdominal: Soft. Bowel sounds are normal. She exhibits no distension. There is no tenderness.  Musculoskeletal: She exhibits no edema and no tenderness.  Lymphadenopathy:    She has no cervical adenopathy.  Neurological: She is alert and oriented to person, place, and time.  Skin: Skin is warm and dry.   Psychiatric: She has a normal mood and affect.          Assessment & Plan:  1. Depression - PARoxetine (PAXIL) 20 MG tablet; Take 1 tablet (20 mg total) by mouth every morning.  Dispense: 30 tablet; Refill: 0 RTO in one week and sooner if symptoms develop or seek emergency medical treatment -Patient verbalized understanding Coralie Keens, FNP-C

## 2013-06-02 ENCOUNTER — Other Ambulatory Visit: Payer: Self-pay

## 2013-06-02 MED ORDER — FENOFIBRATE 145 MG PO TABS: 145.0000 mg | ORAL_TABLET | Freq: Every day | ORAL | Status: DC

## 2013-06-02 NOTE — Telephone Encounter (Signed)
Last lipids 10/02/11

## 2013-06-04 ENCOUNTER — Ambulatory Visit: Payer: BC Managed Care – PPO | Admitting: General Practice

## 2013-06-08 ENCOUNTER — Encounter: Payer: Self-pay | Admitting: General Practice

## 2013-06-08 ENCOUNTER — Ambulatory Visit (INDEPENDENT_AMBULATORY_CARE_PROVIDER_SITE_OTHER): Payer: BC Managed Care – PPO | Admitting: General Practice

## 2013-06-08 VITALS — BP 114/67 | HR 73 | Temp 98.1°F | Ht 67.0 in | Wt 189.0 lb

## 2013-06-08 DIAGNOSIS — F329 Major depressive disorder, single episode, unspecified: Secondary | ICD-10-CM

## 2013-06-08 MED ORDER — ALPRAZOLAM 0.5 MG PO TABS: 0.5000 mg | ORAL_TABLET | Freq: Two times a day (BID) | ORAL | Status: DC

## 2013-06-08 MED ORDER — PAROXETINE HCL 20 MG PO TABS: 20.0000 mg | ORAL_TABLET | ORAL | Status: DC

## 2013-06-08 NOTE — Progress Notes (Signed)
  Subjective:    Patient ID: Kelly Conley, female    DOB: 1960-09-22, 52 y.o.   MRN: [id number]  HPI Patient presents today for follow up of depression. She reports mood swings are more controlled and feels like medication is effective. Anxiety controlled with xanax. She reports taking medications as directed.     Review of Systems  Constitutional: Negative for fever and chills.  Respiratory: Negative for chest tightness and shortness of breath.   Cardiovascular: Negative for chest pain.  Psychiatric/Behavioral: Negative for suicidal ideas, sleep disturbance and self-injury. The patient is nervous/anxious.        Objective:   Physical Exam  Constitutional: She is oriented to person, place, and time. She appears well-developed and well-nourished.  HENT:  Head: Normocephalic and atraumatic.  Right Ear: External ear normal.  Left Ear: External ear normal.  Nose: Nose normal.  Mouth/Throat: Oropharynx is clear and moist.  Eyes: EOM are normal. Pupils are equal, round, and reactive to light.  Neck: Normal range of motion. Neck supple. No thyromegaly present.  Cardiovascular: Normal rate, regular rhythm and normal heart sounds.   Pulmonary/Chest: Effort normal and breath sounds normal. No respiratory distress. She exhibits no tenderness.  Abdominal: Soft. Bowel sounds are normal. She exhibits no distension. There is no tenderness.  Musculoskeletal: She exhibits no edema and no tenderness.  Lymphadenopathy:    She has no cervical adenopathy.  Neurological: She is alert and oriented to person, place, and time.  Skin: Skin is warm and dry.  Psychiatric: She has a normal mood and affect.          Assessment & Plan:  1. Depression - PARoxetine (PAXIL) 20 MG tablet; Take 1 tablet (20 mg total) by mouth every morning.  Dispense: 30 tablet; Refill: 3 -take medication as directed -continue being interactive with family and other individuals -make time for self -RTO if symptoms  worsen or seek emergency medical treatment -Patient verbalized understanding Coralie Keens, FNP-C

## 2013-06-15 ENCOUNTER — Other Ambulatory Visit: Payer: Self-pay | Admitting: General Practice

## 2013-06-15 ENCOUNTER — Telehealth: Payer: Self-pay

## 2013-06-15 DIAGNOSIS — F329 Major depressive disorder, single episode, unspecified: Secondary | ICD-10-CM

## 2013-06-15 MED ORDER — ESCITALOPRAM OXALATE 20 MG PO TABS: 20.0000 mg | ORAL_TABLET | Freq: Every day | ORAL | Status: DC

## 2013-06-15 NOTE — Telephone Encounter (Signed)
Paxil not working well  Upset stomach and vaginal dryness   Can see try Lexapro?

## 2013-06-15 NOTE — Telephone Encounter (Signed)
Yes, discontinue paxil, start lexapro 20mg , one tablet daily and follow up appointment needed in one week.

## 2013-06-17 ENCOUNTER — Telehealth: Payer: Self-pay | Admitting: General Practice

## 2013-06-17 NOTE — Telephone Encounter (Signed)
Patient aware.

## 2013-06-25 ENCOUNTER — Other Ambulatory Visit: Payer: Self-pay

## 2013-07-06 ENCOUNTER — Other Ambulatory Visit: Payer: Self-pay | Admitting: *Deleted

## 2013-07-06 NOTE — Telephone Encounter (Signed)
LAST LABS 2/13. NTBS

## 2013-07-08 ENCOUNTER — Other Ambulatory Visit: Payer: Self-pay | Admitting: General Practice

## 2013-07-08 MED ORDER — FENOFIBRATE 145 MG PO TABS: 145.0000 mg | ORAL_TABLET | Freq: Every day | ORAL | Status: DC

## 2013-07-29 ENCOUNTER — Other Ambulatory Visit: Payer: Self-pay | Admitting: *Deleted

## 2013-07-29 NOTE — Telephone Encounter (Signed)
Last seen and filled 06/08/13, pt will be out on 07/30/13, she called into [facility] 07/23/13. If approved route to pool B, call into [facility] Pt wants a call at [phone] when she can go pick it up

## 2013-07-31 ENCOUNTER — Telehealth: Payer: Self-pay | Admitting: General Practice

## 2013-07-31 MED ORDER — ALPRAZOLAM 0.5 MG PO TABS: 0.5000 mg | ORAL_TABLET | Freq: Two times a day (BID) | ORAL | Status: DC

## 2013-07-31 NOTE — Telephone Encounter (Signed)
Please call into pharmacy

## 2013-07-31 NOTE — Telephone Encounter (Signed)
Pt aware,rx for xanax called for in.

## 2013-07-31 NOTE — Telephone Encounter (Signed)
Called in to [facility] 

## 2013-08-04 ENCOUNTER — Other Ambulatory Visit: Payer: Self-pay | Admitting: General Practice

## 2013-08-05 NOTE — Telephone Encounter (Signed)
Last seen 06/08/13  Kelly Conley  No lipids since [employer]

## 2013-08-10 ENCOUNTER — Encounter: Payer: Self-pay | Admitting: General Practice

## 2013-08-10 ENCOUNTER — Ambulatory Visit (INDEPENDENT_AMBULATORY_CARE_PROVIDER_SITE_OTHER): Payer: BC Managed Care – PPO | Admitting: General Practice

## 2013-08-10 VITALS — BP 119/83 | HR 79 | Temp 97.1°F | Ht 67.0 in | Wt 182.0 lb

## 2013-08-10 DIAGNOSIS — F329 Major depressive disorder, single episode, unspecified: Secondary | ICD-10-CM

## 2013-08-11 NOTE — Progress Notes (Signed)
   Subjective:    Patient ID: Kelly Conley, female    DOB: [birth year], 52 y.o.   MRN: [id number]  HPI Patient presents today for follow up on depression. She reports moods swings are well controlled. Reports she has stopped taking the lexapro. Denies side effects. Denies thoughts of harming self or others.     Review of Systems  Constitutional: Negative for fever and chills.  Respiratory: Negative for chest tightness and shortness of breath.   Cardiovascular: Negative for chest pain.  Neurological: Negative for dizziness, weakness and headaches.  Psychiatric/Behavioral: Negative for suicidal ideas, sleep disturbance and self-injury. The patient is not nervous/anxious.        Objective:   Physical Exam  Constitutional: She is oriented to person, place, and time. She appears well-developed and well-nourished.  HENT:  Head: Normocephalic and atraumatic.  Right Ear: External ear normal.  Left Ear: External ear normal.  Nose: Nose normal.  Mouth/Throat: Oropharynx is clear and moist.  Eyes: EOM are normal. Pupils are equal, round, and reactive to light.  Neck: Normal range of motion. Neck supple. No thyromegaly present.  Cardiovascular: Normal rate, regular rhythm and normal heart sounds.   Pulmonary/Chest: Effort normal and breath sounds normal. No respiratory distress. She exhibits no tenderness.  Abdominal: Soft. Bowel sounds are normal. She exhibits no distension. There is no tenderness.  Musculoskeletal: She exhibits no edema and no tenderness.  Lymphadenopathy:    She has no cervical adenopathy.  Neurological: She is alert and oriented to person, place, and time.  Skin: Skin is warm and dry.  Psychiatric: She has a normal mood and affect.          Assessment & Plan:  1. Depression -continue current medications -discontinue lexapro -RTO for follow up and prn visits Patient verbalized understanding Coralie Keens, FNP-C

## 2013-08-31 ENCOUNTER — Other Ambulatory Visit: Payer: Self-pay | Admitting: *Deleted

## 2013-08-31 NOTE — Telephone Encounter (Signed)
Last seen 08/10/13, last filled 07/29/13. [facility]

## 2013-09-01 MED ORDER — ALPRAZOLAM 0.5 MG PO TABS: 0.5000 mg | ORAL_TABLET | Freq: Two times a day (BID) | ORAL | Status: DC

## 2013-09-01 NOTE — Telephone Encounter (Signed)
Please phone in

## 2013-09-03 ENCOUNTER — Telehealth: Payer: Self-pay | Admitting: General Practice

## 2013-09-03 NOTE — Telephone Encounter (Signed)
Patient was was very upset that this was not called in in a timely manner. I talked with patient and apologized completley called [facility] and spoke with pharmacist  and called it in.

## 2013-09-04 ENCOUNTER — Other Ambulatory Visit: Payer: Self-pay | Admitting: General Practice

## 2013-10-02 ENCOUNTER — Other Ambulatory Visit: Payer: Self-pay | Admitting: *Deleted

## 2013-10-02 MED ORDER — ALPRAZOLAM 0.5 MG PO TABS: 0.5000 mg | ORAL_TABLET | Freq: Two times a day (BID) | ORAL | Status: DC

## 2013-10-02 NOTE — Telephone Encounter (Signed)
Wants a refill on her xanax. If [facility]

## 2013-10-02 NOTE — Telephone Encounter (Signed)
Please phone in

## 2013-10-05 NOTE — Telephone Encounter (Signed)
Called into pharmacy

## 2013-10-31 ENCOUNTER — Other Ambulatory Visit: Payer: Self-pay | Admitting: General Practice

## 2013-11-02 ENCOUNTER — Other Ambulatory Visit: Payer: Self-pay | Admitting: *Deleted

## 2013-11-02 NOTE — Telephone Encounter (Signed)
Patient last seen in office on 08-10-13. Rx last filled on 10-05-13. Please advise. If approved please route to Pool B so [facility]

## 2013-11-03 ENCOUNTER — Telehealth: Payer: Self-pay | Admitting: General Practice

## 2013-11-03 MED ORDER — AMITRIPTYLINE HCL 25 MG PO TABS: 25.0000 mg | ORAL_TABLET | Freq: Every day | ORAL | Status: DC

## 2013-11-03 MED ORDER — ALPRAZOLAM 0.5 MG PO TABS: 0.5000 mg | ORAL_TABLET | Freq: Two times a day (BID) | ORAL | Status: DC

## 2013-11-03 NOTE — Telephone Encounter (Signed)
[facility]

## 2013-11-03 NOTE — Telephone Encounter (Signed)
Please check in basket. Message sent yesterday

## 2013-11-09 ENCOUNTER — Other Ambulatory Visit: Payer: Self-pay | Admitting: General Practice

## 2013-11-09 NOTE — Telephone Encounter (Signed)
addressed

## 2014-01-05 ENCOUNTER — Telehealth: Payer: Self-pay | Admitting: Pharmacist Clinician (PhC)/ Clinical Pharmacy Specialist

## 2014-01-05 NOTE — Telephone Encounter (Signed)
Patient wants to try Belviq for weight loss.  She has a BMI of 29 with elevated lipids.  She has been unsuccessful on her home loosing weight and wants to try this medication.  I reviewed adverse effects with her and also average weight loss seen with this medication.  She had tried phentermine but had insomnia even when it was taken in the morning.  There are no drug interactions with her other medications.  [facility]

## 2014-01-13 ENCOUNTER — Telehealth: Payer: Self-pay | Admitting: *Deleted

## 2014-01-13 NOTE — Telephone Encounter (Signed)
Kelly Conley, Ins denied belviq. Their rational for denying is lack of initial BMI being over 30 and of two or more  Cardiovascular risk factors including LDL >160, HDL<40, hypertension,smoking, impaired fasting glucose, family history of premature coronary heart disease and it goes on to explain exactly which relatives etc.  I could not put together a case with their guidelines.

## 2014-01-19 ENCOUNTER — Ambulatory Visit: Payer: BC Managed Care – PPO

## 2014-01-30 ENCOUNTER — Other Ambulatory Visit: Payer: Self-pay | Admitting: General Practice

## 2014-02-01 ENCOUNTER — Other Ambulatory Visit: Payer: Self-pay | Admitting: General Practice

## 2014-02-01 NOTE — Telephone Encounter (Signed)
Last lipids scanned in 10/14.

## 2014-02-01 NOTE — Telephone Encounter (Signed)
Patient NTBS for follow up and lab work  

## 2014-02-03 NOTE — Telephone Encounter (Signed)
Called refill in to pharmacy    

## 2014-02-03 NOTE — Telephone Encounter (Signed)
Last seen 08/02/13, last filled 01/03/14 by Mae. [facility]

## 2014-02-03 NOTE — Telephone Encounter (Signed)
Please call in xanax with 1 refills 

## 2014-02-15 NOTE — Telephone Encounter (Signed)
Talked with Kelly Conley pt does not want to take makes her sick.

## 2014-03-03 LAB — HM MAMMOGRAPHY

## 2014-03-31 ENCOUNTER — Other Ambulatory Visit: Payer: Self-pay | Admitting: Nurse Practitioner

## 2014-03-31 MED ORDER — ALPRAZOLAM 0.5 MG PO TABS
ORAL_TABLET | ORAL | Status: DC
Start: 2014-03-31 — End: 2014-04-28

## 2014-03-31 NOTE — Telephone Encounter (Signed)
Last ov 12/14. Last refill 03/04/14. [facility] [place]

## 2014-03-31 NOTE — Telephone Encounter (Signed)
Please call in xanax 0.5 1 po BID prn with 0 refills

## 2014-03-31 NOTE — Addendum Note (Signed)
Addended by: Bennie PieriniMARTIN, MARY-MARGARET on: 03/31/2014 04:58 PM   Modules accepted: Orders

## 2014-03-31 NOTE — Telephone Encounter (Addendum)
Patient has an upcoming appt on 04/30/14 with Dr. Hyacinth MeekerMiller. Can we refill x1 until she can be seen?

## 2014-03-31 NOTE — Telephone Encounter (Signed)
To early for refill and needs to be seen

## 2014-04-01 NOTE — Telephone Encounter (Signed)
Called in.

## 2014-04-28 ENCOUNTER — Ambulatory Visit (INDEPENDENT_AMBULATORY_CARE_PROVIDER_SITE_OTHER): Payer: BC Managed Care – PPO | Admitting: Family Medicine

## 2014-04-28 ENCOUNTER — Other Ambulatory Visit: Payer: Self-pay | Admitting: General Practice

## 2014-04-28 ENCOUNTER — Encounter: Payer: Self-pay | Admitting: Family Medicine

## 2014-04-28 VITALS — BP 101/62 | HR 70 | Temp 98.6°F | Ht 67.0 in | Wt 184.0 lb

## 2014-04-28 DIAGNOSIS — G479 Sleep disorder, unspecified: Secondary | ICD-10-CM | POA: Insufficient documentation

## 2014-04-28 DIAGNOSIS — N959 Unspecified menopausal and perimenopausal disorder: Secondary | ICD-10-CM | POA: Insufficient documentation

## 2014-04-28 DIAGNOSIS — E781 Pure hyperglyceridemia: Secondary | ICD-10-CM

## 2014-04-28 DIAGNOSIS — M545 Low back pain, unspecified: Secondary | ICD-10-CM | POA: Insufficient documentation

## 2014-04-28 HISTORY — DX: Unspecified menopausal and perimenopausal disorder: N95.9

## 2014-04-28 MED ORDER — ALPRAZOLAM 0.5 MG PO TABS: ORAL_TABLET | ORAL | Status: DC

## 2014-04-28 NOTE — Progress Notes (Signed)
   Subjective:    Patient ID: Kelly Conley, female    DOB: August 30, 1960, 53 y.o.   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 Regarding the latter she had back surgery and uses hydrocodone for recurrent pain. In reviewing her labs, she takes try triglycerides, but her highest triglyceride level that I have seen over the past 4 years was only 180. I do not think this level needs treatment and have expressed this to her. She sleeps well with Elavil 25 mg and when necessary Xanax Generally she is doing well she gets some walking exercise but seems to enjoy life.    Review of Systems  Constitutional: Negative.   HENT: Negative.   Eyes: Negative.   Respiratory: Negative.   Cardiovascular: Negative.   Gastrointestinal: Negative.   Endocrine: Negative.   Genitourinary: Negative.   Musculoskeletal: Positive for back pain and myalgias.       Bilateral thumb pain  Skin: Rash: not true rash but healing intertrigo.  Hematological: Negative.   Psychiatric/Behavioral: Positive for sleep disturbance.       Objective:   Physical Exam  Constitutional: She is oriented to person, place, and time. She appears well-developed and well-nourished.  Eyes: Conjunctivae and EOM are normal.  Neck: Normal range of motion. Neck supple.  Cardiovascular: Normal rate, regular rhythm and normal heart sounds.   Pulmonary/Chest: Effort normal and breath sounds normal.  Abdominal: Soft. Bowel sounds are normal.  Musculoskeletal: Normal range of motion.  Neurological: She is alert and oriented to person, place, and time. She has normal reflexes.  Skin: Skin is warm and dry.  Psychiatric: She has a normal mood and affect. Her behavior is normal. Thought content normal.     BP 101/62  Pulse 70  Temp(Src) 98.6 F (37 C)  (Oral)  Ht  (1.702 m)  Wt 184 lb (83.462 kg)  BMI 28.81 kg/m2  LMP 11/21/2002     Assessment & Plan:  1. Low back pain without sciatica, unspecified back pain laterality Pain okay with med  2. Sleep disorder Continue Elavil and prn Xanax  3. Menopausal and postmenopausal disorder On hormone patch but cautioned about prolonged use  4. Hypertriglyceridemia I think not an issue; hopefully she will D/C med  Frederica Kuster MD

## 2014-05-03 ENCOUNTER — Other Ambulatory Visit: Payer: Self-pay | Admitting: *Deleted

## 2014-06-11 ENCOUNTER — Other Ambulatory Visit: Payer: Self-pay | Admitting: *Deleted

## 2014-06-11 MED ORDER — FENOFIBRATE 145 MG PO TABS: ORAL_TABLET | ORAL | Status: DC

## 2014-09-11 ENCOUNTER — Other Ambulatory Visit: Payer: Self-pay | Admitting: Family Medicine

## 2014-09-13 NOTE — Telephone Encounter (Signed)
Last seen 04/28/14 Dr Hyacinth MeekerMiller  [facility]

## 2014-10-02 ENCOUNTER — Other Ambulatory Visit: Payer: Self-pay | Admitting: Family Medicine

## 2014-11-02 ENCOUNTER — Other Ambulatory Visit: Payer: Self-pay | Admitting: Pharmacist Clinician (PhC)/ Clinical Pharmacy Specialist

## 2014-11-02 ENCOUNTER — Telehealth: Payer: Self-pay | Admitting: Pharmacist Clinician (PhC)/ Clinical Pharmacy Specialist

## 2014-11-02 ENCOUNTER — Other Ambulatory Visit: Payer: Self-pay | Admitting: Family Medicine

## 2014-11-02 MED ORDER — GABAPENTIN 300 MG PO CAPS: 300.0000 mg | ORAL_CAPSULE | Freq: Every evening | ORAL | Status: DC | PRN

## 2014-11-02 MED ORDER — PHENTERMINE HCL 15 MG PO CAPS: 15.0000 mg | ORAL_CAPSULE | Freq: Every day | ORAL | Status: DC

## 2014-11-02 NOTE — Telephone Encounter (Signed)
Patient has gained over 30 lbs in the past 6 months and has a BMI >30.  She feels like it is do to changes in her oral estrogen.  She is walking 3-4 miles a day and has changed her dietary habits.  Patient tried phentermine before but it caused insomnia.  I spoke with Dr. Christell ConstantMoore about trying the 15mg  strength in the am and he agreed.  I called it into [facility] for [facility]30 and no refills.  Patient has chronic constipation so I am trying to wean her off the amitriptyline she takes at bedtime for neuropathy and transition her to gabapentin 300mg  qhs.

## 2014-11-03 NOTE — Telephone Encounter (Signed)
Last seen 04/28/14 Dr Hyacinth MeekerMiller  If approved [facility]

## 2014-11-05 NOTE — Telephone Encounter (Signed)
rx called into pharmacy

## 2014-11-09 ENCOUNTER — Other Ambulatory Visit: Payer: Self-pay | Admitting: Family Medicine

## 2014-11-30 ENCOUNTER — Other Ambulatory Visit: Payer: Self-pay | Admitting: Pharmacist Clinician (PhC)/ Clinical Pharmacy Specialist

## 2014-11-30 MED ORDER — PHENTERMINE HCL 37.5 MG PO TABS: 37.5000 mg | ORAL_TABLET | Freq: Every day | ORAL | Status: DC

## 2014-11-30 NOTE — Progress Notes (Signed)
Patient has lost 3 lbs with phentermine 15mg  taking it qod (due to insomnia) she will increase to 1/2 of the 37.5mg  strength.  [facility] 114[facility]73 P 72 and weight 183 lb.  No side effects other than insomnia

## 2014-12-12 ENCOUNTER — Other Ambulatory Visit: Payer: Self-pay | Admitting: Family Medicine

## 2014-12-13 NOTE — Telephone Encounter (Signed)
Please advise on refill- according to office note 04/29/15- Dr. Hyacinth MeekerMiller wanted to stop cholesterol medication. No follow up scheduled.

## 2014-12-14 ENCOUNTER — Telehealth: Payer: Self-pay | Admitting: Pharmacist Clinician (PhC)/ Clinical Pharmacy Specialist

## 2014-12-14 NOTE — Telephone Encounter (Signed)
Patient called and nurse at health department checked BP and Weight.  114/73 and 183lbs.  No problems with medication- phentermine.

## 2015-03-15 ENCOUNTER — Encounter: Payer: Self-pay | Admitting: *Deleted

## 2015-03-16 ENCOUNTER — Other Ambulatory Visit: Payer: Self-pay | Admitting: Obstetrics and Gynecology

## 2015-03-18 LAB — CYTOLOGY - PAP

## 2015-03-22 ENCOUNTER — Other Ambulatory Visit: Payer: Self-pay | Admitting: Obstetrics and Gynecology

## 2015-03-22 DIAGNOSIS — R928 Other abnormal and inconclusive findings on diagnostic imaging of breast: Secondary | ICD-10-CM

## 2015-03-25 ENCOUNTER — Other Ambulatory Visit: Payer: Self-pay | Admitting: Obstetrics and Gynecology

## 2015-03-25 ENCOUNTER — Ambulatory Visit
Admission: RE | Admit: 2015-03-25 | Discharge: 2015-03-25 | Disposition: A | Discharge status: Home / Self Care | Payer: Self-pay | Source: Ambulatory Visit | Attending: Obstetrics and Gynecology | Admitting: Obstetrics and Gynecology

## 2015-03-25 ENCOUNTER — Other Ambulatory Visit: Payer: Self-pay

## 2015-03-25 ENCOUNTER — Ambulatory Visit
Admission: RE | Admit: 2015-03-25 | Discharge: 2015-03-25 | Disposition: A | Discharge status: Home / Self Care | Payer: BLUE CROSS/BLUE SHIELD | Source: Ambulatory Visit | Attending: Obstetrics and Gynecology | Admitting: Obstetrics and Gynecology

## 2015-03-25 DIAGNOSIS — R928 Other abnormal and inconclusive findings on diagnostic imaging of breast: Secondary | ICD-10-CM

## 2015-03-30 ENCOUNTER — Encounter: Payer: Self-pay | Admitting: Family Medicine

## 2015-03-30 ENCOUNTER — Ambulatory Visit (INDEPENDENT_AMBULATORY_CARE_PROVIDER_SITE_OTHER): Payer: BLUE CROSS/BLUE SHIELD | Admitting: Family Medicine

## 2015-03-30 VITALS — BP 115/74 | HR 62 | Temp 97.6°F | Ht 67.0 in | Wt 185.2 lb

## 2015-03-30 DIAGNOSIS — G479 Sleep disorder, unspecified: Secondary | ICD-10-CM | POA: Diagnosis not present

## 2015-03-30 DIAGNOSIS — E781 Pure hyperglyceridemia: Secondary | ICD-10-CM | POA: Diagnosis not present

## 2015-03-30 NOTE — Progress Notes (Signed)
   Subjective:    Patient ID: HARBOR PASTER, female    DOB: 1961/02/14, 54 y.o.   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 I reviewed her labs in detail today and generally all her numbers look much improved including lipids. She exercises regularly but is not very good with her diet. In reviewing her labs TSH is low and this could be liberalized might help her metabolism.  Patient Active Problem List   Diagnosis Date Noted  . Low back pain 04/28/2014  . Sleep disorder 04/28/2014  . Menopausal and postmenopausal disorder 04/28/2014  . Hypertriglyceridemia 04/28/2014   Outpatient Encounter Prescriptions as of 03/30/2015  Medication Sig  . ALPRAZolam (XANAX) 0.5 MG tablet TAKE ONE TABLET BY MOUTH TWICE DAILY AS NEEDED  . cetirizine (ZYRTEC) 10 MG tablet Take 10 mg by mouth daily.  Marland Kitchen estradiol (MINIVELLE) 0.0375 MG/24HR Place 1 patch onto the skin 2 (two) times a week.  . fenofibrate (TRICOR) 145 MG tablet TAKE ONE TABLET BY MOUTH ONE TIME DAILY  . fish oil-omega-3 fatty acids 1000 MG capsule Take 2 g by mouth daily.  Marland Kitchen gabapentin (NEURONTIN) 300 MG capsule Take 1 capsule (300 mg total) by mouth at bedtime as needed.  Marland Kitchen HYDROcodone-acetaminophen (NORCO/VICODIN) 5-325 MG per tablet Take 1 tablet by mouth every 8 (eight) hours as needed.   Marland Kitchen levothyroxine (SYNTHROID, LEVOTHROID) 100 MCG tablet Take 50 mcg by mouth daily before breakfast.   . Vitamin D, Ergocalciferol, (DRISDOL) 50000 UNITS CAPS capsule   . [DISCONTINUED] amitriptyline (ELAVIL) 25 MG tablet Take 1 tablet (25 mg total) by mouth at bedtime.  . [DISCONTINUED] amitriptyline (ELAVIL) 25 MG tablet TAKE ONE TABLET BY MOUTH AT BEDTIME  . [DISCONTINUED] phentermine (ADIPEX-P) 37.5 MG tablet Take 1 tablet (37.5 mg total) by mouth daily before breakfast. 1/2-1 tablet daily in the morning before breakfast   No facility-administered encounter medications on  file as of 03/30/2015.      Review of Systems  Constitutional: Negative.   HENT: Negative.   Respiratory: Negative.   Cardiovascular: Negative.   Gastrointestinal: Negative.   Genitourinary: Negative.   Neurological: Negative.   Psychiatric/Behavioral: Negative.        Objective:   Physical Exam  Constitutional: She is oriented to person, place, and time. She appears well-developed and well-nourished.  Cardiovascular: Normal rate and regular rhythm.   Pulmonary/Chest: Effort normal and breath sounds normal.  Abdominal: Soft.  Musculoskeletal: Normal range of motion.  Neurological: She is alert and oriented to person, place, and time.  Psychiatric: She has a normal mood and affect. Her behavior is normal.          Assessment & Plan:  1. Sleep disorder Continue with alprazolam.  2. Hypertriglyceridemia Continue with TriCor for triglycerides  Frederica Kuster MD

## 2015-03-31 MED ORDER — GABAPENTIN 300 MG PO CAPS
300.0000 mg | ORAL_CAPSULE | Freq: Every evening | ORAL | Status: DC | PRN
Start: 2015-03-31 — End: 2016-10-17

## 2015-03-31 MED ORDER — ALPRAZOLAM 0.5 MG PO TABS: 0.5000 mg | ORAL_TABLET | Freq: Two times a day (BID) | ORAL | Status: DC | PRN

## 2015-03-31 MED ORDER — LEVOTHYROXINE SODIUM 100 MCG PO TABS: 50.0000 ug | ORAL_TABLET | Freq: Every day | ORAL | Status: DC

## 2015-03-31 NOTE — Addendum Note (Signed)
Addended by: Gwenith Daily on: 03/31/2015 11:58 AM   Modules accepted: Orders

## 2015-06-10 ENCOUNTER — Other Ambulatory Visit: Payer: Self-pay | Admitting: Family Medicine

## 2015-06-13 NOTE — Telephone Encounter (Signed)
Last seen 03/30/15  Dr Hyacinth MeekerMiller  [facility]

## 2015-07-10 ENCOUNTER — Other Ambulatory Visit: Payer: Self-pay | Admitting: Family Medicine

## 2015-07-11 NOTE — Telephone Encounter (Signed)
Last seen 03/30/15  Dr Hyacinth MeekerMiller  [facility]

## 2015-07-19 ENCOUNTER — Ambulatory Visit (INDEPENDENT_AMBULATORY_CARE_PROVIDER_SITE_OTHER): Payer: BLUE CROSS/BLUE SHIELD | Admitting: Family Medicine

## 2015-07-19 ENCOUNTER — Encounter: Payer: Self-pay | Admitting: Family Medicine

## 2015-07-19 VITALS — BP 128/87 | HR 74 | Temp 98.0°F | Ht 67.0 in | Wt 189.4 lb

## 2015-07-19 DIAGNOSIS — R0602 Shortness of breath: Secondary | ICD-10-CM | POA: Diagnosis not present

## 2015-07-19 MED ORDER — METHYLPREDNISOLONE ACETATE 80 MG/ML IJ SUSP
80.0000 mg | Freq: Once | INTRAMUSCULAR | Status: AC
  Administered 2015-07-19: 80 mg via INTRAMUSCULAR

## 2015-07-19 MED ORDER — AZITHROMYCIN 250 MG PO TABS: ORAL_TABLET | ORAL | Status: DC

## 2015-07-19 MED ORDER — FLUTICASONE FUROATE-VILANTEROL 100-25 MCG/INH IN AEPB: INHALATION_SPRAY | RESPIRATORY_TRACT | Status: DC

## 2015-07-19 NOTE — Progress Notes (Signed)
   Subjective:    Patient ID: Kelly CousinsJanet L Conley, female    DOB: 1961/01/08, 54 y.o.   MRN: [id number]  HPI 54 year old female with respiratory infection. She tells me that she had the flu last week with fever and myalgias treated symptomatically with DayQuil and NyQuil. Now she still has residual cough is sometimes productive of dark sputum. She's not had any recent fever or chills and has not had chest pain.  Patient Active Problem List   Diagnosis Date Noted  . Low back pain 04/28/2014  . Sleep disorder 04/28/2014  . Menopausal and postmenopausal disorder 04/28/2014  . Hypertriglyceridemia 04/28/2014   Outpatient Encounter Prescriptions as of 07/19/2015  Medication Sig  . ALPRAZolam (XANAX) 0.5 MG tablet Take 1 tablet (0.5 mg total) by mouth 2 (two) times daily as needed.  . cetirizine (ZYRTEC) 10 MG tablet Take 10 mg by mouth daily.  Marland Kitchen. estradiol (MINIVELLE) 0.0375 MG/24HR Place 1 patch onto the skin 2 (two) times a week.  . fenofibrate (TRICOR) 145 MG tablet TAKE ONE TABLET BY MOUTH ONE  TIME DAILY  . fish oil-omega-3 fatty acids 1000 MG capsule Take 2 g by mouth daily.  Marland Kitchen. gabapentin (NEURONTIN) 300 MG capsule Take 1 capsule (300 mg total) by mouth at bedtime as needed.  Marland Kitchen. HYDROcodone-acetaminophen (NORCO/VICODIN) 5-325 MG per tablet Take 1 tablet by mouth every 8 (eight) hours as needed.   Marland Kitchen. levothyroxine (SYNTHROID, LEVOTHROID) 100 MCG tablet Take 0.5 tablets (50 mcg total) by mouth daily before breakfast.  . [DISCONTINUED] Vitamin D, Ergocalciferol, (DRISDOL) 50000 UNITS CAPS capsule    No facility-administered encounter medications on file as of 07/19/2015.      Review of Systems  Constitutional: Negative.   HENT: Positive for congestion.   Respiratory: Positive for cough.   Cardiovascular: Negative.   Neurological: Negative.   Psychiatric/Behavioral: Negative.        Objective:   Physical Exam  Constitutional: She is oriented to person, place, and time. She appears  well-developed and well-nourished.  Cardiovascular: Normal rate and regular rhythm.   Pulmonary/Chest: Effort normal. She has wheezes.  Neurological: She is alert and oriented to person, place, and time.          Assessment & Plan:  1. SOB (shortness of breath) I think patient probably has some reactive airways and post respiratory syndrome secondary to the flu. Will use an inhaler with long-acting bronchodilator and steroid and also since she is coughing up dark sputum will give her a Z-Pak - methylPREDNISolone acetate (DEPO-MEDROL) injection 80 mg; Inject 1 mL (80 mg total) into the muscle once.

## 2015-08-07 ENCOUNTER — Other Ambulatory Visit: Payer: Self-pay | Admitting: Family Medicine

## 2015-08-08 NOTE — Telephone Encounter (Signed)
Patient wants refill of Tricor, has not had cholesterol number checked in 6+ months

## 2015-08-29 ENCOUNTER — Other Ambulatory Visit: Payer: Self-pay | Admitting: Family Medicine

## 2015-08-29 NOTE — Telephone Encounter (Signed)
Last filled 07/29/15, last seen 07/19/15. [facility]

## 2015-08-30 NOTE — Telephone Encounter (Signed)
Refill left [facility] on [facility]

## 2015-09-07 ENCOUNTER — Other Ambulatory Visit: Payer: Self-pay | Admitting: Family Medicine

## 2015-09-07 NOTE — Telephone Encounter (Signed)
Last seen 07/19/15 Dr Hyacinth Meeker   Last lipid 05/19/14

## 2015-09-27 ENCOUNTER — Other Ambulatory Visit: Payer: Self-pay | Admitting: Family Medicine

## 2015-09-28 NOTE — Telephone Encounter (Signed)
Last seen 07/19/15  Dr Hyacinth Meeker  If approved route [facility]

## 2015-09-29 NOTE — Telephone Encounter (Signed)
[facility] 

## 2015-10-30 ENCOUNTER — Other Ambulatory Visit: Payer: Self-pay | Admitting: Family Medicine

## 2015-10-31 NOTE — Telephone Encounter (Signed)
Last filled 09/29/15, last seen by Hyacinth MeekerMiller 07/19/15. Enough till he comes back, call in at drug store and call pt.

## 2015-11-11 ENCOUNTER — Telehealth: Payer: Self-pay

## 2015-11-11 NOTE — Telephone Encounter (Signed)
Dr. Channing Muttersoy suggested she be seen by an Endocrinologist due to weight gain; He feels this could be attributing to her back pain. Can you please put in a referral for her?

## 2015-11-13 ENCOUNTER — Other Ambulatory Visit: Payer: Self-pay | Admitting: Family Medicine

## 2015-11-14 ENCOUNTER — Other Ambulatory Visit: Payer: Self-pay

## 2015-11-14 DIAGNOSIS — E039 Hypothyroidism, unspecified: Secondary | ICD-10-CM

## 2015-11-17 NOTE — Telephone Encounter (Signed)
Okay to refer to endocrinology.

## 2015-12-01 ENCOUNTER — Other Ambulatory Visit: Payer: Self-pay | Admitting: Family Medicine

## 2015-12-01 NOTE — Telephone Encounter (Signed)
Called into pharmacy

## 2015-12-01 NOTE — Telephone Encounter (Signed)
Last seen 07/19/15  Dr Hyacinth MeekerMiller  If approved route to nurse to call [facility]

## 2015-12-19 ENCOUNTER — Ambulatory Visit: Payer: BLUE CROSS/BLUE SHIELD | Admitting: "Endocrinology

## 2016-02-02 ENCOUNTER — Other Ambulatory Visit: Payer: Self-pay | Admitting: Family Medicine

## 2016-02-02 NOTE — Telephone Encounter (Signed)
Last seen 11/29./16   Dr Hyacinth MeekerMiller  If approved route to nurse to call [facility]

## 2016-03-09 ENCOUNTER — Other Ambulatory Visit: Payer: Self-pay | Admitting: *Deleted

## 2016-03-09 MED ORDER — ALPRAZOLAM 0.5 MG PO TABS: 0.5000 mg | ORAL_TABLET | Freq: Three times a day (TID) | ORAL | Status: DC | PRN

## 2016-03-09 NOTE — Telephone Encounter (Signed)
Called into pharmacy

## 2016-03-16 ENCOUNTER — Other Ambulatory Visit: Payer: Self-pay | Admitting: Obstetrics & Gynecology

## 2016-03-20 ENCOUNTER — Other Ambulatory Visit: Payer: Self-pay | Admitting: Obstetrics & Gynecology

## 2016-03-20 DIAGNOSIS — R928 Other abnormal and inconclusive findings on diagnostic imaging of breast: Secondary | ICD-10-CM

## 2016-03-20 LAB — CYTOLOGY - PAP

## 2016-03-22 ENCOUNTER — Ambulatory Visit (INDEPENDENT_AMBULATORY_CARE_PROVIDER_SITE_OTHER): Payer: BLUE CROSS/BLUE SHIELD | Admitting: Family Medicine

## 2016-03-22 ENCOUNTER — Encounter: Payer: Self-pay | Admitting: Family Medicine

## 2016-03-22 VITALS — BP 110/77 | HR 70 | Temp 97.7°F | Ht 67.0 in | Wt 186.0 lb

## 2016-03-22 DIAGNOSIS — E039 Hypothyroidism, unspecified: Secondary | ICD-10-CM

## 2016-03-22 DIAGNOSIS — E781 Pure hyperglyceridemia: Secondary | ICD-10-CM

## 2016-03-22 DIAGNOSIS — N959 Unspecified menopausal and perimenopausal disorder: Secondary | ICD-10-CM

## 2016-03-22 MED ORDER — LEVOTHYROXINE SODIUM 100 MCG PO TABS: 100.0000 ug | ORAL_TABLET | Freq: Every day | ORAL | 6 refills | Status: DC

## 2016-03-22 NOTE — Patient Instructions (Signed)
Continue current medications. Continue good therapeutic lifestyle changes which include good diet and exercise. Fall precautions discussed with patient. If an FOBT was given today- please return it to our front desk. If you are over 56 years old - you may need Prevnar 13 or the adult Pneumonia vaccine.   After your visit with us today you will receive a survey in the mail or online from [employer] regarding your care with us. Please take a moment to fill this out. Your feedback is very important to us as you can help us better understand your patient needs as well as improve your experience and satisfaction. WE CARE ABOUT YOU!!!    

## 2016-03-22 NOTE — Progress Notes (Signed)
Subjective:    Patient ID: Kelly Conley, female    DOB: Oct 14, 1960, 55 y.o.   MRN: [id number]  HPI Pt here for follow up and management of chronic medical problems which includes hypothyroid and hyperlipidemia. She is taking medications regularly.   Her lipid issues have centered primarily around triglyceride levels. She had formally been on TriCor but we stopped it. Lab work done last month showed triglycerides at 352 with a total cholesterol of 291 and LDL of 173 with HDL 48. These numbers were applied to the cardiovascular risk assessment formula and the recommendation is that she does not need to be treated for her lipids. Other risk factors including smoking diabetes hypertension are negative.  She had a screening mammogram which showed some suspicious areas and is due for further evaluation neck week. She does take estrogen replacement therapy per her gynecologist and continues to have hot flashes.   Patient Active Problem List   Diagnosis Date Noted  . Low back pain 04/28/2014  . Sleep disorder 04/28/2014  . Menopausal and postmenopausal disorder 04/28/2014  . Hypertriglyceridemia 04/28/2014   Outpatient Encounter Prescriptions as of 03/22/2016  Medication Sig  . ALPRAZolam (XANAX) 0.5 MG tablet Take 1 tablet (0.5 mg total) by mouth 3 (three) times daily as needed for anxiety.  . cetirizine (ZYRTEC) 10 MG tablet Take 10 mg by mouth daily.  Marland Kitchen estradiol (MINIVELLE) 0.0375 MG/24HR Place 1 patch onto the skin 2 (two) times a week.  . fish oil-omega-3 fatty acids 1000 MG capsule Take 2 g by mouth daily.  Marland Kitchen gabapentin (NEURONTIN) 300 MG capsule Take 1 capsule (300 mg total) by mouth at bedtime as needed.  Marland Kitchen HYDROcodone-acetaminophen (NORCO/VICODIN) 5-325 MG per tablet Take 1 tablet by mouth every 8 (eight) hours as needed.   Marland Kitchen levothyroxine (SYNTHROID, LEVOTHROID) 100 MCG tablet Take 0.5 tablets (50 mcg total) by mouth daily before breakfast.  . [DISCONTINUED] azithromycin (ZITHROMAX  Z-PAK) 250 MG tablet Take 2 tablets first day then 1 tablet daily for 4 days  . [DISCONTINUED] fenofibrate (TRICOR) 145 MG tablet TAKE ONE (1) TABLET EACH DAY  . [DISCONTINUED] Fluticasone Furoate-Vilanterol (BREO ELLIPTA) 100-25 MCG/INH AEPB 1 inhalation daily   No facility-administered encounter medications on file as of 03/22/2016.      Review of Systems  Constitutional: Negative.   HENT: Negative.   Eyes: Negative.   Respiratory: Negative.   Cardiovascular: Negative.   Gastrointestinal: Negative.   Endocrine: Negative.   Genitourinary: Negative.   Musculoskeletal: Negative.   Skin: Negative.   Allergic/Immunologic: Negative.   Neurological: Negative.   Hematological: Negative.   Psychiatric/Behavioral: Negative.        Objective:   Physical Exam  Constitutional: She is oriented to person, place, and time. She appears well-developed and well-nourished.  HENT:  Head: Normocephalic.  Cardiovascular: Normal rate, regular rhythm and normal heart sounds.   Pulmonary/Chest: Effort normal and breath sounds normal.  Abdominal: Soft. There is no tenderness. There is no guarding.  Neurological: She is alert and oriented to person, place, and time.  Psychiatric: She has a normal mood and affect. Her behavior is normal. Judgment and thought content normal.   BP 110/77 (BP Location: Left Arm)   Pulse 70   Temp 97.7 F (36.5 C) (Oral)   Ht 5\' 7"  (1.702 m)   Wt 186 lb (84.4 kg)   LMP 11/21/2002   BMI 29.13 kg/m         Assessment & Plan:  1. Hypertriglyceridemia As noted  above recommendation is for no lipid-lowering agents now other than facial. I am not sure patient believes this assessment.  2. Hypothyroidism, unspecified hypothyroidism type TSH is normal per her labs from last month  3. Menopausal and postmenopausal disorder She continues with estrogen replacement depending on results of her mammogram may have to discontinue that She had previously tried soy  preparations black cohosh without relief  Frederica Kuster MD

## 2016-03-26 ENCOUNTER — Ambulatory Visit
Admission: RE | Admit: 2016-03-26 | Discharge: 2016-03-26 | Disposition: A | Discharge status: Home / Self Care | Payer: BLUE CROSS/BLUE SHIELD | Source: Ambulatory Visit | Attending: Obstetrics & Gynecology | Admitting: Obstetrics & Gynecology

## 2016-03-26 DIAGNOSIS — R928 Other abnormal and inconclusive findings on diagnostic imaging of breast: Secondary | ICD-10-CM

## 2016-03-28 ENCOUNTER — Other Ambulatory Visit: Payer: BLUE CROSS/BLUE SHIELD

## 2016-04-10 ENCOUNTER — Other Ambulatory Visit: Payer: Self-pay | Admitting: *Deleted

## 2016-04-10 NOTE — Telephone Encounter (Signed)
Refill due for xanax - if approved - route to pool A for nurse to phone in

## 2016-04-11 MED ORDER — ALPRAZOLAM 0.5 MG PO TABS: 0.5000 mg | ORAL_TABLET | Freq: Three times a day (TID) | ORAL | 2 refills | Status: DC | PRN

## 2016-07-16 ENCOUNTER — Telehealth: Payer: Self-pay | Admitting: *Deleted

## 2016-07-17 MED ORDER — ALPRAZOLAM 0.5 MG PO TABS: 0.5000 mg | ORAL_TABLET | Freq: Three times a day (TID) | ORAL | 2 refills | Status: DC | PRN

## 2016-07-17 NOTE — Telephone Encounter (Signed)
Rx refilled per patient request 

## 2016-08-13 ENCOUNTER — Other Ambulatory Visit: Payer: Self-pay | Admitting: Family Medicine

## 2016-08-16 ENCOUNTER — Other Ambulatory Visit: Payer: Self-pay | Admitting: Family Medicine

## 2016-08-22 ENCOUNTER — Telehealth: Payer: Self-pay | Admitting: Family Medicine

## 2016-08-22 NOTE — Telephone Encounter (Signed)
Patient needs to come in and see Dr. Hyacinth MeekerMiller as he will have to reduce her thyroid medicine . He will then need to recheck another thyroid profile after she had been on a reduced dose for a period of time.

## 2016-08-22 NOTE — Telephone Encounter (Signed)
Patient aware of recommendations.  

## 2016-08-23 NOTE — Telephone Encounter (Signed)
Aware.  New script for medicine will be done by Dr. Hyacinth MeekerMiller.

## 2016-08-24 ENCOUNTER — Other Ambulatory Visit: Payer: Self-pay | Admitting: *Deleted

## 2016-09-16 ENCOUNTER — Other Ambulatory Visit: Payer: Self-pay | Admitting: Family Medicine

## 2016-10-17 ENCOUNTER — Encounter: Payer: Self-pay | Admitting: Physician Assistant

## 2016-10-17 ENCOUNTER — Ambulatory Visit (INDEPENDENT_AMBULATORY_CARE_PROVIDER_SITE_OTHER): Payer: BLUE CROSS/BLUE SHIELD | Admitting: Physician Assistant

## 2016-10-17 VITALS — BP 128/74 | HR 79 | Temp 98.0°F | Ht 67.0 in | Wt 189.2 lb

## 2016-10-17 DIAGNOSIS — M545 Low back pain, unspecified: Secondary | ICD-10-CM

## 2016-10-17 DIAGNOSIS — N959 Unspecified menopausal and perimenopausal disorder: Secondary | ICD-10-CM | POA: Diagnosis not present

## 2016-10-17 DIAGNOSIS — F419 Anxiety disorder, unspecified: Secondary | ICD-10-CM

## 2016-10-17 DIAGNOSIS — G479 Sleep disorder, unspecified: Secondary | ICD-10-CM

## 2016-10-17 DIAGNOSIS — E039 Hypothyroidism, unspecified: Secondary | ICD-10-CM

## 2016-10-17 DIAGNOSIS — G8929 Other chronic pain: Secondary | ICD-10-CM

## 2016-10-17 DIAGNOSIS — E781 Pure hyperglyceridemia: Secondary | ICD-10-CM

## 2016-10-17 DIAGNOSIS — R632 Polyphagia: Secondary | ICD-10-CM

## 2016-10-17 MED ORDER — GABAPENTIN 300 MG PO CAPS: 600.0000 mg | ORAL_CAPSULE | Freq: Every evening | ORAL | 5 refills | Status: DC | PRN

## 2016-10-17 MED ORDER — ALPRAZOLAM 0.5 MG PO TABS: 0.5000 mg | ORAL_TABLET | Freq: Three times a day (TID) | ORAL | 2 refills | Status: DC | PRN

## 2016-10-17 MED ORDER — BUPROPION HCL ER (SR) 150 MG PO TB12: 150.0000 mg | ORAL_TABLET | Freq: Every day | ORAL | 2 refills | Status: DC

## 2016-10-17 NOTE — Patient Instructions (Signed)
In a few days you may receive a survey in the mail or online from [employer] regarding your visit with us today. Please take a moment to fill this out. Your feedback is very important to our whole office. It can help us better understand your needs as well as improve your experience and satisfaction. Thank you for taking your time to complete it. We care about you.  Azaylia Fong, PA-C  

## 2016-10-22 ENCOUNTER — Telehealth: Payer: Self-pay | Admitting: Physician Assistant

## 2016-10-22 DIAGNOSIS — F419 Anxiety disorder, unspecified: Secondary | ICD-10-CM | POA: Insufficient documentation

## 2016-10-22 DIAGNOSIS — E782 Mixed hyperlipidemia: Secondary | ICD-10-CM

## 2016-10-22 DIAGNOSIS — E039 Hypothyroidism, unspecified: Secondary | ICD-10-CM | POA: Insufficient documentation

## 2016-10-22 MED ORDER — ATORVASTATIN CALCIUM 20 MG PO TABS: 20.0000 mg | ORAL_TABLET | Freq: Every day | ORAL | 3 refills | Status: DC

## 2016-10-22 NOTE — Telephone Encounter (Signed)
Patient states that she has been on vytorin and zetia in the past.

## 2016-10-22 NOTE — Progress Notes (Signed)
BP 128/74   Pulse 79   Temp 98 F (36.7 C) (Oral)   Ht 5\' 7"  (1.702 m)   Wt 189 lb 3.2 oz (85.8 kg)   LMP 11/21/2002   BMI 29.63 kg/m    Subjective:    Patient ID: Kelly Conley, female    DOB: 30-Jul-1961, 56 y.o.   MRN: [id number]  HPI: Kelly Conley is a 56 y.o. female presenting on 10/17/2016 for Establish Care (with you since Hyacinth Meeker is leaving ) This patient comes in for periodic recheck on medications and conditions including depression, lipid results, hypertension, hypothyroidism, osteopenia, menopause. Her menopausal symptoms are quite pronounced at times. Medications are reviewed and updated below. She still is fairly depressed due to the loss of her mother just a few months ago. This is also a she was concerned about her blood work and the risk for stroke because this is what her mother ultimately passed away from around age 12.  All medications are reviewed today. There are no reports of any problems with the medications. All of the medical conditions are reviewed and updated.  Lab work is reviewed and will be ordered as medically necessary. There are no new problems reported with today's visit.  ASCVD risk 3.3% next 10 years." I discussed with the patient starting a statin for her elevated LDL and total cholesterol. She had labs performed at work and it showed her LDL to be 171 total cholesterol 278 and triglycerides of 282.  Relevant past medical, surgical, family and social history reviewed and updated as indicated. Allergies and medications reviewed and updated.  Past Medical History:  Diagnosis Date  . Allergy   . Anemia   . Anxiety   . DDD (degenerative disc disease), lumbar   . Depression   . GERD (gastroesophageal reflux disease)   . Hypothyroidism   . Osteopenia   . Postmenopausal     Past Surgical History:  Procedure Laterality Date  . ABDOMINAL HYSTERECTOMY    . BACK SURGERY      Review of Systems  Constitutional: Negative.  Negative for activity  change, fatigue and fever.  HENT: Negative.   Eyes: Negative.   Respiratory: Negative.  Negative for cough.   Cardiovascular: Negative.  Negative for chest pain.  Gastrointestinal: Negative.  Negative for abdominal pain.  Endocrine: Positive for heat intolerance.  Genitourinary: Negative.  Negative for dysuria.  Musculoskeletal: Positive for back pain, gait problem and myalgias.  Skin: Negative.     Allergies as of 10/17/2016      Reactions   Codeine Other (See Comments)   Headache   Sulfa Antibiotics Rash      Medication List       Accurate as of 10/17/16 11:59 PM. Always use your most recent med list.          ALPRAZolam 0.5 MG tablet Commonly known as:  XANAX Take 1 tablet (0.5 mg total) by mouth 3 (three) times daily as needed for anxiety.   buPROPion 150 MG 12 hr tablet Commonly known as:  WELLBUTRIN SR Take 1 tablet (150 mg total) by mouth daily.   cetirizine 10 MG tablet Commonly known as:  ZYRTEC Take 10 mg by mouth daily.   estradiol 0.075 MG/24HR Commonly known as:  VIVELLE-DOT Place 1 patch onto the skin once a week.   fish oil-omega-3 fatty acids 1000 MG capsule Take 2 g by mouth daily.   gabapentin 300 MG capsule Commonly known as:  NEURONTIN Take 2 capsules (600  mg total) by mouth at bedtime as needed.   HYDROcodone-acetaminophen 5-325 MG tablet Commonly known as:  NORCO/VICODIN Take 1 tablet by mouth every 8 (eight) hours as needed.   levothyroxine 25 MCG tablet Commonly known as:  SYNTHROID, LEVOTHROID Take 25 mcg by mouth daily before breakfast.          Objective:    BP 128/74   Pulse 79   Temp 98 F (36.7 C) (Oral)   Ht 5\' 7"  (1.702 m)   Wt 189 lb 3.2 oz (85.8 kg)   LMP 11/21/2002   BMI 29.63 kg/m   Allergies  Allergen Reactions  . Codeine Other (See Comments)    Headache  . Sulfa Antibiotics Rash    Physical Exam  Constitutional: She is oriented to person, place, and time. She appears well-developed and well-nourished.   HENT:  Head: Normocephalic and atraumatic.  Eyes: Conjunctivae and EOM are normal. Pupils are equal, round, and reactive to light.  Cardiovascular: Normal rate, regular rhythm, normal heart sounds and intact distal pulses.   Pulmonary/Chest: Effort normal and breath sounds normal.  Abdominal: Soft. Bowel sounds are normal.  Neurological: She is alert and oriented to person, place, and time. She has normal reflexes.  Skin: Skin is warm and dry. No rash noted.  Psychiatric: She has a normal mood and affect. Her behavior is normal. Judgment and thought content normal.        Assessment & Plan:   1. Sleep disorder  2. Menopausal and postmenopausal disorder - estradiol (VIVELLE-DOT) 0.075 MG/24HR; Place 1 patch onto the skin once a week. ; Refill: 10 - gabapentin (NEURONTIN) 300 MG capsule; Take 2 capsules (600 mg total) by mouth at bedtime as needed.  Dispense: 60 capsule; Refill: 5  3. Chronic low back pain without sciatica, unspecified back pain laterality - gabapentin (NEURONTIN) 300 MG capsule; Take 2 capsules (600 mg total) by mouth at bedtime as needed.  Dispense: 60 capsule; Refill: 5  4. Hypertriglyceridemia  5. Overeating - buPROPion (WELLBUTRIN SR) 150 MG 12 hr tablet; Take 1 tablet (150 mg total) by mouth daily.  Dispense: 30 tablet; Refill: 2  6. Anxiety - ALPRAZolam (XANAX) 0.5 MG tablet; Take 1 tablet (0.5 mg total) by mouth 3 (three) times daily as needed for anxiety.  Dispense: 90 tablet; Refill: 2  7. Hypothyroidism, unspecified type - levothyroxine (SYNTHROID, LEVOTHROID) 25 MCG tablet; Take 25 mcg by mouth daily before breakfast. ; Refill: 0  8. Hyperlipid: consider lipid therapy with statin  Continue all other maintenance medications as listed above.  Follow up plan: Return in about 2 months (around 12/15/2016).  Educational handout given for lipid lowering  Remus LofflerAngel S. Demaryius Imran PA-C [facility] [address]  [place], [place]  [id number] [phone]   10/22/2016, 1:57 PM

## 2016-10-22 NOTE — Telephone Encounter (Signed)
Patient states she is willing to take a statin and she only request a generic.

## 2016-10-22 NOTE — Addendum Note (Signed)
Addended by: Remus LofflerJONES, Shelbia Scinto S on: 10/22/2016 05:15 PM   Modules accepted: Orders

## 2016-10-23 NOTE — Telephone Encounter (Signed)
Patient aware that Rx for lipitor has been sent to pharmacy.  Patient will call if having any issues with medications.

## 2016-12-28 ENCOUNTER — Ambulatory Visit (INDEPENDENT_AMBULATORY_CARE_PROVIDER_SITE_OTHER): Payer: BLUE CROSS/BLUE SHIELD | Admitting: Family

## 2016-12-28 ENCOUNTER — Encounter: Payer: Self-pay | Admitting: Family

## 2016-12-28 VITALS — BP 132/87 | HR 79 | Temp 98.8°F | Ht 67.0 in | Wt 196.0 lb

## 2016-12-28 DIAGNOSIS — J029 Acute pharyngitis, unspecified: Secondary | ICD-10-CM

## 2016-12-28 DIAGNOSIS — J069 Acute upper respiratory infection, unspecified: Secondary | ICD-10-CM | POA: Diagnosis not present

## 2016-12-28 DIAGNOSIS — R05 Cough: Secondary | ICD-10-CM

## 2016-12-28 DIAGNOSIS — R059 Cough, unspecified: Secondary | ICD-10-CM

## 2016-12-28 LAB — RAPID STREP SCREEN (MED CTR MEBANE ONLY): Strep Gp A Ag, IA W/Reflex: NEGATIVE

## 2016-12-28 LAB — CULTURE, GROUP A STREP

## 2016-12-28 MED ORDER — FLUTICASONE PROPIONATE 50 MCG/ACT NA SUSP: 2.0000 | Freq: Every day | NASAL | 6 refills | Status: DC

## 2016-12-28 MED ORDER — BENZONATATE 200 MG PO CAPS: 200.0000 mg | ORAL_CAPSULE | Freq: Three times a day (TID) | ORAL | 1 refills | Status: DC | PRN

## 2016-12-28 MED ORDER — HYDROCODONE-HOMATROPINE 5-1.5 MG/5ML PO SYRP: 5.0000 mL | ORAL_SOLUTION | Freq: Three times a day (TID) | ORAL | 0 refills | Status: DC | PRN

## 2016-12-28 MED ORDER — AZITHROMYCIN 250 MG PO TABS: ORAL_TABLET | ORAL | 0 refills | Status: DC

## 2016-12-28 NOTE — Addendum Note (Signed)
Addended by: Jannifer RodneyHAWKS, Tyrone Balash A on: 12/28/2016 08:35 AM   Modules accepted: Orders

## 2016-12-28 NOTE — Patient Instructions (Signed)
Upper Respiratory Infection, Adult Most upper respiratory infections (URIs) are a viral infection of the air passages leading to the lungs. A URI affects the nose, throat, and upper air passages. The most common type of URI is nasopharyngitis and is typically referred to as "the common cold." URIs run their course and usually go away on their own. Most of the time, a URI does not require medical attention, but sometimes a bacterial infection in the upper airways can follow a viral infection. This is called a secondary infection. Sinus and middle ear infections are common types of secondary upper respiratory infections. Bacterial pneumonia can also complicate a URI. A URI can worsen asthma and chronic obstructive pulmonary disease (COPD). Sometimes, these complications can require emergency medical care and may be life threatening. What are the causes? Almost all URIs are caused by viruses. A virus is a type of germ and can spread from one person to another. What increases the risk? You may be at risk for a URI if:  You smoke.  You have chronic heart or lung disease.  You have a weakened defense (immune) system.  You are very young or very old.  You have nasal allergies or asthma.  You work in crowded or poorly ventilated areas.  You work in health care facilities or schools.  What are the signs or symptoms? Symptoms typically develop 2-3 days after you come in contact with a cold virus. Most viral URIs last 7-10 days. However, viral URIs from the influenza virus (flu virus) can last 14-18 days and are typically more severe. Symptoms may include:  Runny or stuffy (congested) nose.  Sneezing.  Cough.  Sore throat.  Headache.  Fatigue.  Fever.  Loss of appetite.  Pain in your forehead, behind your eyes, and over your cheekbones (sinus pain).  Muscle aches.  How is this diagnosed? Your health care provider may diagnose a URI by:  Physical exam.  Tests to check that your  symptoms are not due to another condition such as: ? Strep throat. ? Sinusitis. ? Pneumonia. ? Asthma.  How is this treated? A URI goes away on its own with time. It cannot be cured with medicines, but medicines may be prescribed or recommended to relieve symptoms. Medicines may help:  Reduce your fever.  Reduce your cough.  Relieve nasal congestion.  Follow these instructions at home:  Take medicines only as directed by your health care provider.  Gargle warm saltwater or take cough drops to comfort your throat as directed by your health care provider.  Use a warm mist humidifier or inhale steam from a shower to increase air moisture. This may make it easier to breathe.  Drink enough fluid to keep your urine clear or pale yellow.  Eat soups and other clear broths and maintain good nutrition.  Rest as needed.  Return to work when your temperature has returned to normal or as your health care provider advises. You may need to stay home longer to avoid infecting others. You can also use a face mask and careful hand washing to prevent spread of the virus.  Increase the usage of your inhaler if you have asthma.  Do not use any tobacco products, including cigarettes, chewing tobacco, or electronic cigarettes. If you need help quitting, ask your health care provider. How is this prevented? The best way to protect yourself from getting a cold is to practice good hygiene.  Avoid oral or hand contact with people with cold symptoms.  Wash your   hands often if contact occurs.  There is no clear evidence that vitamin C, vitamin E, echinacea, or exercise reduces the chance of developing a cold. However, it is always recommended to get plenty of rest, exercise, and practice good nutrition. Contact a health care provider if:  You are getting worse rather than better.  Your symptoms are not controlled by medicine.  You have chills.  You have worsening shortness of breath.  You have  brown or red mucus.  You have yellow or brown nasal discharge.  You have pain in your face, especially when you bend forward.  You have a fever.  You have swollen neck glands.  You have pain while swallowing.  You have white areas in the back of your throat. Get help right away if:  You have severe or persistent: ? Headache. ? Ear pain. ? Sinus pain. ? Chest pain.  You have chronic lung disease and any of the following: ? Wheezing. ? Prolonged cough. ? Coughing up blood. ? A change in your usual mucus.  You have a stiff neck.  You have changes in your: ? Vision. ? Hearing. ? Thinking. ? Mood. This information is not intended to replace advice given to you by your health care provider. Make sure you discuss any questions you have with your health care provider. Document Released: 01/30/2001 Document Revised: 04/08/2016 Document Reviewed: 11/11/2013 Elsevier Interactive Patient Education  2017 [employer]  

## 2016-12-28 NOTE — Progress Notes (Addendum)
   Subjective:    Patient ID: Kelly Conley, female    DOB: 1961/06/04, 56 y.o.   MRN: [id number]  Cough  Associated symptoms include ear pain and headaches.  Sore Throat   This is a new problem. The current episode started in the past 7 days. The problem has been waxing and waning. Maximum temperature: 99. The pain is at a severity of 6/10. The pain is moderate. Associated symptoms include congestion, coughing, ear pain, headaches, a hoarse voice, a plugged ear sensation, swollen glands and trouble swallowing. Pertinent negatives include no ear discharge. She has tried gargles and acetaminophen for the symptoms. The treatment provided mild relief.  Headache   Associated symptoms include coughing, ear pain and swollen glands.      Review of Systems  HENT: Positive for congestion, ear pain, hoarse voice and trouble swallowing. Negative for ear discharge.   Respiratory: Positive for cough.   Neurological: Positive for headaches.  All other systems reviewed and are negative.      Objective:   Physical Exam  Constitutional: She is oriented to person, place, and time. She appears well-developed and well-nourished. No distress.  HENT:  Head: Normocephalic and atraumatic.  Right Ear: External ear normal.  Left Ear: External ear normal.  Nose: Mucosal edema and rhinorrhea present.  Mouth/Throat: Posterior oropharyngeal erythema present.  Eyes: Pupils are equal, round, and reactive to light.  Neck: Normal range of motion. Neck supple. No thyromegaly present.  Cardiovascular: Normal rate, regular rhythm, normal heart sounds and intact distal pulses.   No murmur heard. Pulmonary/Chest: Effort normal and breath sounds normal. No respiratory distress. She has no wheezes.  Abdominal: Soft. Bowel sounds are normal. She exhibits no distension. There is no tenderness.  Musculoskeletal: Normal range of motion. She exhibits no edema or tenderness.  Neurological: She is alert and oriented to  person, place, and time. She has normal reflexes. No cranial nerve deficit.  Skin: Skin is warm and dry.  Psychiatric: She has a normal mood and affect. Her behavior is normal. Judgment and thought content normal.  Vitals reviewed.     BP 132/87   Pulse 79   Temp 98.8 F (37.1 C) (Oral)   Ht 5\' 7"  (1.702 m)   Wt 196 lb (88.9 kg)   LMP 11/21/2002   BMI 30.70 kg/m      Assessment & Plan:  1. Sore throat - [facility] screen ([facility])  2. Upper respiratory tract infection, unspecified type - Take meds as prescribed - Use a cool mist humidifier  -Use saline nose sprays frequently -Saline irrigations of the nose can be very helpful if done frequently. -Force fluids -For any cough or congestion  Use plain Mucinex- regular strength or max strength is fine   * Children- consult with Pharmacist for dosing -For fever or aces or pains- take tylenol or ibuprofen appropriate for age and weight. -Throat lozenges if help -New toothbrush in 3 days - fluticasone (FLONASE) 50 MCG/ACT nasal spray; Place 2 sprays into both nostrils daily.  Dispense: 16 g; Refill: 6  *Will send in Zpak since it is the weekend. PT is worried that her symptoms will worsen. Discussed not taking antibiotic unless her symptoms worsen.  Jannifer Rodneyhristy Marquail Bradwell, FNP

## 2017-01-06 ENCOUNTER — Other Ambulatory Visit: Payer: Self-pay | Admitting: Physician Assistant

## 2017-01-06 DIAGNOSIS — G8929 Other chronic pain: Secondary | ICD-10-CM

## 2017-01-06 DIAGNOSIS — M545 Low back pain, unspecified: Secondary | ICD-10-CM

## 2017-01-06 DIAGNOSIS — N959 Unspecified menopausal and perimenopausal disorder: Secondary | ICD-10-CM

## 2017-01-20 ENCOUNTER — Other Ambulatory Visit: Payer: Self-pay | Admitting: Family Medicine

## 2017-01-20 DIAGNOSIS — F419 Anxiety disorder, unspecified: Secondary | ICD-10-CM

## 2017-01-21 NOTE — Telephone Encounter (Signed)
Refill called to [facility]

## 2017-01-21 NOTE — Telephone Encounter (Signed)
Last filled 12/15/16, last seen 09/17/16. Route to pool

## 2017-01-23 ENCOUNTER — Other Ambulatory Visit: Payer: Self-pay | Admitting: Obstetrics & Gynecology

## 2017-01-23 DIAGNOSIS — Z1231 Encounter for screening mammogram for malignant neoplasm of breast: Secondary | ICD-10-CM

## 2017-01-29 ENCOUNTER — Encounter: Payer: Self-pay | Admitting: Physician Assistant

## 2017-01-29 ENCOUNTER — Ambulatory Visit (INDEPENDENT_AMBULATORY_CARE_PROVIDER_SITE_OTHER): Payer: BLUE CROSS/BLUE SHIELD | Admitting: Physician Assistant

## 2017-01-29 VITALS — BP 134/86 | HR 61 | Temp 98.6°F | Ht 67.0 in | Wt 191.0 lb

## 2017-01-29 DIAGNOSIS — F419 Anxiety disorder, unspecified: Secondary | ICD-10-CM

## 2017-01-29 DIAGNOSIS — E781 Pure hyperglyceridemia: Secondary | ICD-10-CM | POA: Diagnosis not present

## 2017-01-29 DIAGNOSIS — N959 Unspecified menopausal and perimenopausal disorder: Secondary | ICD-10-CM

## 2017-01-29 DIAGNOSIS — M545 Low back pain, unspecified: Secondary | ICD-10-CM

## 2017-01-29 DIAGNOSIS — R635 Abnormal weight gain: Secondary | ICD-10-CM

## 2017-01-29 DIAGNOSIS — G8929 Other chronic pain: Secondary | ICD-10-CM

## 2017-01-29 DIAGNOSIS — E039 Hypothyroidism, unspecified: Secondary | ICD-10-CM | POA: Diagnosis not present

## 2017-01-29 MED ORDER — EZETIMIBE-SIMVASTATIN 10-20 MG PO TABS: 1.0000 | ORAL_TABLET | Freq: Every day | ORAL | 11 refills | Status: DC

## 2017-01-29 MED ORDER — ALPRAZOLAM 0.5 MG PO TABS: 0.5000 mg | ORAL_TABLET | Freq: Three times a day (TID) | ORAL | 2 refills | Status: DC | PRN

## 2017-01-29 MED ORDER — PHENTERMINE HCL 37.5 MG PO CAPS: 37.5000 mg | ORAL_CAPSULE | ORAL | 2 refills | Status: DC

## 2017-01-29 NOTE — Patient Instructions (Signed)
In a few days you may receive a survey in the mail or online from [employer] regarding your visit with us today. Please take a moment to fill this out. Your feedback is very important to our whole office. It can help us better understand your needs as well as improve your experience and satisfaction. Thank you for taking your time to complete it. We care about you.  Arletha Marschke, PA-C  

## 2017-01-30 NOTE — Progress Notes (Signed)
BP 134/86   Pulse 61   Temp 98.6 F (37 C) (Oral)   Ht 5\' 7"  (1.702 m)   Wt 191 lb (86.6 kg)   LMP 11/21/2002   BMI 29.91 kg/m    Subjective:    Patient ID: Kelly Conley, female    DOB: 07/07/1961, 56 y.o.   MRN: [id number]  HPI: Kelly Conley is a 56 y.o. female presenting on 01/29/2017 for No chief complaint on file.  This patient comes in for periodic recheck on medications and conditions including Anxiety, hyperlipidemia, menopause, chronic low back pain. Overall she is doing well. Her labs show her cholesterol to be greatly improved on Vytorin. We are going to continue this medication this time. Other refills will be sent as needed. She is also to be taking Adipex with today's needed for appetite control. She is goning to send monthly weights and blood pressure readings from [facility].  All medications are reviewed today. There are no reports of any problems with the medications. All of the medical conditions are reviewed and updated.  Lab work is reviewed and will be ordered as medically necessary. There are no new problems reported with today's visit.   Relevant past medical, surgical, family and social history reviewed and updated as indicated. Allergies and medications reviewed and updated.  Past Medical History:  Diagnosis Date  . Allergy   . Anemia   . Anxiety   . DDD (degenerative disc disease), lumbar   . Depression   . GERD (gastroesophageal reflux disease)   . Hypothyroidism   . Osteopenia   . Postmenopausal     Past Surgical History:  Procedure Laterality Date  . ABDOMINAL HYSTERECTOMY    . BACK SURGERY      Review of Systems  Constitutional: Negative.  Negative for activity change, fatigue and fever.  HENT: Negative.   Eyes: Negative.   Respiratory: Negative.  Negative for cough.   Cardiovascular: Negative.  Negative for chest pain.  Gastrointestinal: Negative.  Negative for abdominal pain.  Endocrine: Negative.     Genitourinary: Negative.  Negative for dysuria.  Musculoskeletal: Negative.   Skin: Negative.   Neurological: Negative.     Allergies as of 01/29/2017      Reactions   Codeine Other (See Comments)   Headache   Sulfa Antibiotics Rash      Medication List       Accurate as of 01/29/17 11:59 PM. Always use your most recent med list.          ALPRAZolam 0.5 MG tablet Commonly known as:  XANAX Take 1 tablet (0.5 mg total) by mouth 3 (three) times daily as needed for anxiety.   benzonatate 200 MG capsule Commonly known as:  TESSALON Take 1 capsule (200 mg total) by mouth 3 (three) times daily as needed.   cetirizine 10 MG tablet Commonly known as:  ZYRTEC Take 10 mg by mouth daily.   estradiol 0.075 MG/24HR Commonly known as:  VIVELLE-DOT Place 1 patch onto the skin once a week.   ezetimibe-simvastatin 10-20 MG tablet Commonly known as:  VYTORIN Take 1 tablet by mouth daily.   fish oil-omega-3 fatty acids 1000 MG capsule Take 2 g by mouth daily.   fluticasone 50 MCG/ACT nasal spray Commonly known as:  FLONASE Place 2 sprays into both nostrils daily.   gabapentin 300 MG capsule Commonly known as:  NEURONTIN Take 2 capsules (600 mg total) by mouth at bedtime as needed.  HYDROcodone-acetaminophen 5-325 MG tablet Commonly known as:  NORCO/VICODIN Take 1 tablet by mouth every 8 (eight) hours as needed.   levothyroxine 25 MCG tablet Commonly known as:  SYNTHROID, LEVOTHROID Take 25 mcg by mouth daily before breakfast.   phentermine 37.5 MG capsule Take 1 capsule (37.5 mg total) by mouth every morning.          Objective:    BP 134/86   Pulse 61   Temp 98.6 F (37 C) (Oral)   Ht 5\' 7"  (1.702 m)   Wt 191 lb (86.6 kg)   LMP 11/21/2002   BMI 29.91 kg/m   Allergies  Allergen Reactions  . Codeine Other (See Comments)    Headache  . Sulfa Antibiotics Rash    Physical Exam      Assessment & Plan:   1. Hypothyroidism, unspecified type  2.  Menopausal and postmenopausal disorder  3. Chronic low back pain without sciatica, unspecified back pain laterality  4. Hypertriglyceridemia - ezetimibe-simvastatin (VYTORIN) 10-20 MG tablet; Take 1 tablet by mouth daily.  Dispense: 90 tablet; Refill: 11  5. Anxiety - ALPRAZolam (XANAX) 0.5 MG tablet; Take 1 tablet (0.5 mg total) by mouth 3 (three) times daily as needed for anxiety.  Dispense: 90 tablet; Refill: 2  6. Weight gain - phentermine 37.5 MG capsule; Take 1 capsule (37.5 mg total) by mouth every morning.  Dispense: 30 capsule; Refill: 2   Current Outpatient Prescriptions:  .  ALPRAZolam (XANAX) 0.5 MG tablet, Take 1 tablet (0.5 mg total) by mouth 3 (three) times daily as needed for anxiety., Disp: 90 tablet, Rfl: 2 .  benzonatate (TESSALON) 200 MG capsule, Take 1 capsule (200 mg total) by mouth 3 (three) times daily as needed., Disp: 30 capsule, Rfl: 1 .  cetirizine (ZYRTEC) 10 MG tablet, Take 10 mg by mouth daily., Disp: , Rfl:  .  estradiol (VIVELLE-DOT) 0.075 MG/24HR, Place 1 patch onto the skin once a week. , Disp: , Rfl: 10 .  ezetimibe-simvastatin (VYTORIN) 10-20 MG tablet, Take 1 tablet by mouth daily., Disp: 90 tablet, Rfl: 11 .  fish oil-omega-3 fatty acids 1000 MG capsule, Take 2 g by mouth daily., Disp: , Rfl:  .  fluticasone (FLONASE) 50 MCG/ACT nasal spray, Place 2 sprays into both nostrils daily., Disp: 16 g, Rfl: 6 .  gabapentin (NEURONTIN) 300 MG capsule, Take 2 capsules (600 mg total) by mouth at bedtime as needed., Disp: 60 capsule, Rfl: 5 .  HYDROcodone-acetaminophen (NORCO/VICODIN) 5-325 MG per tablet, Take 1 tablet by mouth every 8 (eight) hours as needed. , Disp: , Rfl:  .  levothyroxine (SYNTHROID, LEVOTHROID) 25 MCG tablet, Take 25 mcg by mouth daily before breakfast. , Disp: , Rfl: 0 .  phentermine 37.5 MG capsule, Take 1 capsule (37.5 mg total) by mouth every morning., Disp: 30 capsule, Rfl: 2  Continue all other maintenance medications as listed  above.  Follow up plan: Return in about 3 months (around 05/01/2017) for recheck.  Educational handout given for survey  Remus LofflerAngel S. Kimani Bedoya PA-C [facility] [address]  [place], [place] [id number] [phone]   01/30/2017, 2:05 PM

## 2017-03-27 ENCOUNTER — Ambulatory Visit
Admission: RE | Admit: 2017-03-27 | Discharge: 2017-03-27 | Disposition: A | Discharge status: Home / Self Care | Payer: Commercial Managed Care - PPO | Source: Ambulatory Visit | Attending: Obstetrics & Gynecology | Admitting: Obstetrics & Gynecology

## 2017-03-27 DIAGNOSIS — Z1231 Encounter for screening mammogram for malignant neoplasm of breast: Secondary | ICD-10-CM

## 2017-04-22 ENCOUNTER — Other Ambulatory Visit: Payer: Self-pay | Admitting: Physician Assistant

## 2017-04-22 DIAGNOSIS — F419 Anxiety disorder, unspecified: Secondary | ICD-10-CM

## 2017-04-23 NOTE — Telephone Encounter (Signed)
Called in.

## 2017-05-28 ENCOUNTER — Other Ambulatory Visit: Payer: Self-pay | Admitting: Physician Assistant

## 2017-05-28 DIAGNOSIS — R635 Abnormal weight gain: Secondary | ICD-10-CM

## 2017-05-28 MED ORDER — PHENTERMINE HCL 37.5 MG PO CAPS
37.5000 mg | ORAL_CAPSULE | ORAL | 2 refills | Status: DC
Start: 2017-05-28 — End: 2017-10-02

## 2017-05-28 NOTE — Progress Notes (Signed)
Phoned in to pharm 

## 2017-06-20 ENCOUNTER — Other Ambulatory Visit: Payer: Self-pay | Admitting: Physician Assistant

## 2017-06-20 DIAGNOSIS — F419 Anxiety disorder, unspecified: Secondary | ICD-10-CM

## 2017-06-21 NOTE — Telephone Encounter (Signed)
rx called into pharmacy

## 2017-09-18 ENCOUNTER — Other Ambulatory Visit: Payer: Self-pay | Admitting: Physician Assistant

## 2017-09-18 DIAGNOSIS — F419 Anxiety disorder, unspecified: Secondary | ICD-10-CM

## 2017-10-02 ENCOUNTER — Encounter: Payer: Self-pay | Admitting: Physician Assistant

## 2017-10-02 ENCOUNTER — Ambulatory Visit (INDEPENDENT_AMBULATORY_CARE_PROVIDER_SITE_OTHER): Payer: Commercial Managed Care - PPO | Admitting: Physician Assistant

## 2017-10-02 DIAGNOSIS — F419 Anxiety disorder, unspecified: Secondary | ICD-10-CM

## 2017-10-02 DIAGNOSIS — R635 Abnormal weight gain: Secondary | ICD-10-CM

## 2017-10-02 DIAGNOSIS — N959 Unspecified menopausal and perimenopausal disorder: Secondary | ICD-10-CM

## 2017-10-02 DIAGNOSIS — G8929 Other chronic pain: Secondary | ICD-10-CM

## 2017-10-02 DIAGNOSIS — M545 Low back pain, unspecified: Secondary | ICD-10-CM

## 2017-10-02 MED ORDER — PHENTERMINE HCL 37.5 MG PO CAPS: 37.5000 mg | ORAL_CAPSULE | ORAL | 4 refills | Status: DC

## 2017-10-02 MED ORDER — VITAMIN D (ERGOCALCIFEROL) 1.25 MG (50000 UNIT) PO CAPS: 50000.0000 [IU] | ORAL_CAPSULE | ORAL | 11 refills | Status: DC

## 2017-10-02 MED ORDER — ALPRAZOLAM 0.5 MG PO TABS: ORAL_TABLET | ORAL | 5 refills | Status: DC

## 2017-10-02 MED ORDER — GABAPENTIN 300 MG PO CAPS: 600.0000 mg | ORAL_CAPSULE | Freq: Every evening | ORAL | 5 refills | Status: DC | PRN

## 2017-10-02 NOTE — Patient Instructions (Signed)
In a few days you may receive a survey in the mail or online from [employer] regarding your visit with us today. Please take a moment to fill this out. Your feedback is very important to our whole office. It can help us better understand your needs as well as improve your experience and satisfaction. Thank you for taking your time to complete it. We care about you.  Infantof Villagomez, PA-C  

## 2017-10-06 NOTE — Progress Notes (Signed)
BP 110/80   Pulse 79   Ht 5\' 7"  (1.702 m)   Wt 190 lb 3.2 oz (86.3 kg)   LMP 11/21/2002   BMI 29.79 kg/m    Subjective:    Patient ID: Kelly Conley, female    DOB: February 06, 1961, 57 y.o.   MRN: [id number]  HPI: Kelly Conley is a 57 y.o. female presenting on 10/02/2017 for Follow-up (6 month )   This patient comes in for 64-month recheck on her conditions.  Overall she is doing well with her medications.  She still sees her gastroenterologist.  She is still on vitamin D and GERD medication.  She has had lab work done that is been normal.  Refills will be performed today and we will see her back as needed. Relevant past medical, surgical, family and social history reviewed and updated as indicated. Allergies and medications reviewed and updated.  Past Medical History:  Diagnosis Date  . Allergy   . Anemia   . Anxiety   . DDD (degenerative disc disease), lumbar   . Depression   . GERD (gastroesophageal reflux disease)   . Hypothyroidism   . Osteopenia   . Postmenopausal     Past Surgical History:  Procedure Laterality Date  . ABDOMINAL HYSTERECTOMY    . BACK SURGERY      Review of Systems  Constitutional: Negative.  Negative for activity change, fatigue and fever.  HENT: Negative.   Eyes: Negative.   Respiratory: Negative.  Negative for cough.   Cardiovascular: Negative.  Negative for chest pain.  Gastrointestinal: Negative.  Negative for abdominal pain.  Endocrine: Negative.   Genitourinary: Negative.  Negative for dysuria.  Musculoskeletal: Negative.   Skin: Negative.   Neurological: Negative.     Allergies as of 10/02/2017      Reactions   Codeine Other (See Comments)   Headache   Sulfa Antibiotics Rash      Medication List        Accurate as of 10/02/17 11:59 PM. Always use your most recent med list.          ALPRAZolam 0.5 MG tablet Commonly known as:  XANAX TAKE ONE TABLET 3 TIMES A DAY AS NEEDED.   cetirizine 10 MG tablet Commonly known as:   ZYRTEC Take 10 mg by mouth daily.   estradiol 0.075 MG/24HR Commonly known as:  VIVELLE-DOT Place 1 patch onto the skin once a week.   ezetimibe-simvastatin 10-20 MG tablet Commonly known as:  VYTORIN Take 1 tablet by mouth daily.   fish oil-omega-3 fatty acids 1000 MG capsule Take 2 g by mouth daily.   gabapentin 300 MG capsule Commonly known as:  NEURONTIN Take 2 capsules (600 mg total) by mouth at bedtime as needed.   HYDROcodone-acetaminophen 5-325 MG tablet Commonly known as:  NORCO/VICODIN Take 1 tablet by mouth every 8 (eight) hours as needed.   levothyroxine 25 MCG tablet Commonly known as:  SYNTHROID, LEVOTHROID Take 25 mcg by mouth daily before breakfast.   phentermine 37.5 MG capsule Take 1 capsule (37.5 mg total) by mouth every morning.   Vitamin D (Ergocalciferol) 50000 units Caps capsule Commonly known as:  DRISDOL Take 1 capsule (50,000 Units total) by mouth 2 (two) times a week.          Objective:    BP 110/80   Pulse 79   Ht 5\' 7"  (1.702 m)   Wt 190 lb 3.2 oz (86.3 kg)   LMP 11/21/2002   BMI 29.79  kg/m   Allergies  Allergen Reactions  . Codeine Other (See Comments)    Headache  . Sulfa Antibiotics Rash    Physical Exam  Constitutional: She is oriented to person, place, and time. She appears well-developed and well-nourished.  HENT:  Head: Normocephalic and atraumatic.  Eyes: Conjunctivae and EOM are normal. Pupils are equal, round, and reactive to light.  Cardiovascular: Normal rate, regular rhythm, normal heart sounds and intact distal pulses.  Pulmonary/Chest: Effort normal and breath sounds normal.  Abdominal: Soft. Bowel sounds are normal.  Neurological: She is alert and oriented to person, place, and time. She has normal reflexes.  Skin: Skin is warm and dry. No rash noted.  Psychiatric: She has a normal mood and affect. Her behavior is normal. Judgment and thought content normal.  Nursing note and vitals reviewed.   Results  for orders placed or performed in visit on 12/28/16  Rapid strep screen (not at [facility])  Result Value Ref Range   Strep Gp A Ag, IA W/Reflex Negative Negative  Culture, Group A Strep  Result Value Ref Range   Strep A Culture CANCELED       Assessment & Plan:   1. Anxiety - ALPRAZolam (XANAX) 0.5 MG tablet; TAKE ONE TABLET 3 TIMES A DAY AS NEEDED.  Dispense: 90 tablet; Refill: 5  2. Weight gain - phentermine 37.5 MG capsule; Take 1 capsule (37.5 mg total) by mouth every morning.  Dispense: 30 capsule; Refill: 4  3. Menopausal and postmenopausal disorder - gabapentin (NEURONTIN) 300 MG capsule; Take 2 capsules (600 mg total) by mouth at bedtime as needed.  Dispense: 60 capsule; Refill: 5  4. Chronic low back pain without sciatica, unspecified back pain laterality - gabapentin (NEURONTIN) 300 MG capsule; Take 2 capsules (600 mg total) by mouth at bedtime as needed.  Dispense: 60 capsule; Refill: 5    Current Outpatient Medications:  .  ALPRAZolam (XANAX) 0.5 MG tablet, TAKE ONE TABLET 3 TIMES A DAY AS NEEDED., Disp: 90 tablet, Rfl: 5 .  cetirizine (ZYRTEC) 10 MG tablet, Take 10 mg by mouth daily., Disp: , Rfl:  .  estradiol (VIVELLE-DOT) 0.075 MG/24HR, Place 1 patch onto the skin once a week. , Disp: , Rfl: 10 .  ezetimibe-simvastatin (VYTORIN) 10-20 MG tablet, Take 1 tablet by mouth daily., Disp: 90 tablet, Rfl: 11 .  fish oil-omega-3 fatty acids 1000 MG capsule, Take 2 g by mouth daily., Disp: , Rfl:  .  gabapentin (NEURONTIN) 300 MG capsule, Take 2 capsules (600 mg total) by mouth at bedtime as needed., Disp: 60 capsule, Rfl: 5 .  HYDROcodone-acetaminophen (NORCO/VICODIN) 5-325 MG per tablet, Take 1 tablet by mouth every 8 (eight) hours as needed. , Disp: , Rfl:  .  levothyroxine (SYNTHROID, LEVOTHROID) 25 MCG tablet, Take 25 mcg by mouth daily before breakfast. , Disp: , Rfl: 0 .  phentermine 37.5 MG capsule, Take 1 capsule (37.5 mg total) by mouth every morning., Disp: 30 capsule,  Rfl: 4 .  Vitamin D, Ergocalciferol, (DRISDOL) 50000 units CAPS capsule, Take 1 capsule (50,000 Units total) by mouth 2 (two) times a week., Disp: 8 capsule, Rfl: 11 Continue all other maintenance medications as listed above.  Follow up plan: Return in about 6 months (around 04/01/2018) for recheck.  Educational handout given for survey  Remus LofflerAngel S. Misa Fedorko PA-C [facility] [address]  [place], [place] [address] [phone]   10/06/2017, 5:33 PM

## 2018-02-11 ENCOUNTER — Encounter: Payer: Self-pay | Admitting: *Deleted

## 2018-02-27 ENCOUNTER — Other Ambulatory Visit: Payer: Self-pay | Admitting: Obstetrics & Gynecology

## 2018-02-27 DIAGNOSIS — Z1231 Encounter for screening mammogram for malignant neoplasm of breast: Secondary | ICD-10-CM

## 2018-03-18 ENCOUNTER — Other Ambulatory Visit: Payer: Self-pay | Admitting: Physician Assistant

## 2018-03-18 DIAGNOSIS — F419 Anxiety disorder, unspecified: Secondary | ICD-10-CM

## 2018-03-19 NOTE — Telephone Encounter (Signed)
[facility]02/17  [facility]

## 2018-04-01 ENCOUNTER — Ambulatory Visit: Payer: Commercial Managed Care - PPO

## 2018-04-02 ENCOUNTER — Ambulatory Visit: Payer: Commercial Managed Care - PPO | Admitting: Physician Assistant

## 2018-04-03 ENCOUNTER — Ambulatory Visit
Admission: RE | Admit: 2018-04-03 | Discharge: 2018-04-03 | Disposition: A | Discharge status: Home / Self Care | Payer: Commercial Managed Care - PPO | Source: Ambulatory Visit | Attending: Obstetrics & Gynecology | Admitting: Obstetrics & Gynecology

## 2018-04-03 DIAGNOSIS — Z1231 Encounter for screening mammogram for malignant neoplasm of breast: Secondary | ICD-10-CM

## 2018-04-09 ENCOUNTER — Encounter: Payer: Self-pay | Admitting: Physician Assistant

## 2018-04-09 ENCOUNTER — Ambulatory Visit (INDEPENDENT_AMBULATORY_CARE_PROVIDER_SITE_OTHER): Payer: Commercial Managed Care - PPO | Admitting: Physician Assistant

## 2018-04-09 VITALS — BP 100/68 | HR 68 | Temp 98.1°F | Ht 67.0 in | Wt 187.0 lb

## 2018-04-09 DIAGNOSIS — E039 Hypothyroidism, unspecified: Secondary | ICD-10-CM | POA: Diagnosis not present

## 2018-04-09 DIAGNOSIS — F419 Anxiety disorder, unspecified: Secondary | ICD-10-CM

## 2018-04-09 MED ORDER — SERTRALINE HCL 50 MG PO TABS: 50.0000 mg | ORAL_TABLET | Freq: Every day | ORAL | 5 refills | Status: DC

## 2018-04-09 MED ORDER — TOPIRAMATE 50 MG PO TABS: 50.0000 mg | ORAL_TABLET | Freq: Two times a day (BID) | ORAL | 2 refills | Status: DC

## 2018-04-09 MED ORDER — ALPRAZOLAM 0.5 MG PO TABS: 0.5000 mg | ORAL_TABLET | Freq: Three times a day (TID) | ORAL | 5 refills | Status: DC | PRN

## 2018-04-14 NOTE — Progress Notes (Signed)
BP 100/68   Pulse 68   Temp 98.1 F (36.7 C) (Oral)   Ht 5\' 7"  (1.702 m)   Wt 187 lb (84.8 kg)   LMP 11/21/2002   BMI 29.29 kg/m    Subjective:    Patient ID: Kelly Conley, female    DOB: January 24, 1961, 57 y.o.   MRN: [id number]  HPI: Kelly CousinsJanet L Conley is a 57 y.o. female presenting on 04/09/2018 for Medication Refill  This patient comes in for periodic recheck on medications and conditions including anxiety, hypothyroidism and she reports overll doing well. Her mother's death anniversary is coming up and she is getting down. She has taken and antidepressant in the past and theinks she need to start one..   All medications are reviewed today. There are no reports of any problems with the medications. All of the medical conditions are reviewed and updated.  Lab work is reviewed and will be ordered as medically necessary. There are no new problems reported with today's visit.  Past Medical History:  Diagnosis Date  . Allergy   . Anemia   . Anxiety   . DDD (degenerative disc disease), lumbar   . Depression   . GERD (gastroesophageal reflux disease)   . Hypothyroidism   . Osteopenia   . Postmenopausal    Relevant past medical, surgical, family and social history reviewed and updated as indicated. Interim medical history since our last visit reviewed. Allergies and medications reviewed and updated. DATA REVIEWED: CHART IN EPIC  Family History reviewed for pertinent findings.  Review of Systems  Constitutional: Negative.  Negative for activity change, fatigue and fever.  HENT: Negative.   Eyes: Negative.   Respiratory: Negative.  Negative for cough.   Cardiovascular: Negative.  Negative for chest pain.  Gastrointestinal: Negative.  Negative for abdominal pain.  Endocrine: Negative.   Genitourinary: Negative.  Negative for dysuria.  Musculoskeletal: Negative.   Skin: Negative.   Neurological: Negative.   Psychiatric/Behavioral: Positive for dysphoric mood. The patient is  nervous/anxious.     Allergies as of 04/09/2018      Reactions   Codeine Other (See Comments)   Headache   Sulfa Antibiotics Rash      Medication List        Accurate as of 04/09/18 11:59 PM. Always use your most recent med list.          ALPRAZolam 0.5 MG tablet Commonly known as:  XANAX Take 1 tablet (0.5 mg total) by mouth 3 (three) times daily as needed for anxiety.   cetirizine 10 MG tablet Commonly known as:  ZYRTEC Take 10 mg by mouth daily.   ezetimibe-simvastatin 10-20 MG tablet Commonly known as:  VYTORIN Take 1 tablet by mouth daily.   fish oil-omega-3 fatty acids 1000 MG capsule Take 2 g by mouth daily.   gabapentin 300 MG capsule Commonly known as:  NEURONTIN Take 2 capsules (600 mg total) by mouth at bedtime as needed.   HYDROcodone-acetaminophen 5-325 MG tablet Commonly known as:  NORCO/VICODIN Take 1 tablet by mouth every 8 (eight) hours as needed.   levothyroxine 25 MCG tablet Commonly known as:  SYNTHROID, LEVOTHROID Take 25 mcg by mouth daily before breakfast.   phentermine 37.5 MG capsule Take 1 capsule (37.5 mg total) by mouth every morning.   sertraline 50 MG tablet Commonly known as:  ZOLOFT Take 1 tablet (50 mg total) by mouth daily.   topiramate 50 MG tablet Commonly known as:  TOPAMAX Take 1-2 tablets (50-100 mg  total) by mouth 2 (two) times daily.   Vitamin D (Ergocalciferol) 50000 units Caps capsule Commonly known as:  DRISDOL Take 1 capsule (50,000 Units total) by mouth 2 (two) times a week.          Objective:    BP 100/68   Pulse 68   Temp 98.1 F (36.7 C) (Oral)   Ht 5\' 7"  (1.702 m)   Wt 187 lb (84.8 kg)   LMP 11/21/2002   BMI 29.29 kg/m   Allergies  Allergen Reactions  . Codeine Other (See Comments)    Headache  . Sulfa Antibiotics Rash    Wt Readings from Last 3 Encounters:  04/09/18 187 lb (84.8 kg)  10/02/17 190 lb 3.2 oz (86.3 kg)  01/29/17 191 lb (86.6 kg)    Physical Exam  Constitutional: She  is oriented to person, place, and time. She appears well-developed and well-nourished.  HENT:  Head: Normocephalic and atraumatic.  Eyes: Pupils are equal, round, and reactive to light. Conjunctivae and EOM are normal.  Cardiovascular: Normal rate, regular rhythm, normal heart sounds and intact distal pulses.  Pulmonary/Chest: Effort normal and breath sounds normal.  Abdominal: Soft. Bowel sounds are normal.  Neurological: She is alert and oriented to person, place, and time. She has normal reflexes.  Skin: Skin is warm and dry. No rash noted.  Psychiatric: She has a normal mood and affect. Her behavior is normal. Judgment and thought content normal.        Assessment & Plan:   1. Anxiety - sertraline (ZOLOFT) 50 MG tablet; Take 1 tablet (50 mg total) by mouth daily.  Dispense: 30 tablet; Refill: 5 - ALPRAZolam (XANAX) 0.5 MG tablet; Take 1 tablet (0.5 mg total) by mouth 3 (three) times daily as needed for anxiety.  Dispense: 90 tablet; Refill: 5  2. Hypothyroidism, unspecified type Labs normal, brought copy from her work exam Recheck 1 year   Continue all other maintenance medications as listed above.  Follow up plan: Return in about 3 months (around 07/10/2018) for recheck.  Educational handout given for survey  Remus Loffler PA-C [facility] [address]  [place], [place] [address] [phone]   04/14/2018, 10:22 PM

## 2018-07-11 ENCOUNTER — Ambulatory Visit: Payer: Commercial Managed Care - PPO | Admitting: Physician Assistant

## 2018-07-15 ENCOUNTER — Telehealth: Payer: Self-pay | Admitting: *Deleted

## 2018-07-15 NOTE — Telephone Encounter (Signed)
Pt gets flu shot at work ([facility])

## 2018-09-30 ENCOUNTER — Ambulatory Visit (INDEPENDENT_AMBULATORY_CARE_PROVIDER_SITE_OTHER): Payer: Commercial Managed Care - PPO | Admitting: Physician Assistant

## 2018-09-30 ENCOUNTER — Encounter: Payer: Self-pay | Admitting: Physician Assistant

## 2018-09-30 DIAGNOSIS — N959 Unspecified menopausal and perimenopausal disorder: Secondary | ICD-10-CM | POA: Diagnosis not present

## 2018-09-30 DIAGNOSIS — F419 Anxiety disorder, unspecified: Secondary | ICD-10-CM | POA: Diagnosis not present

## 2018-09-30 DIAGNOSIS — G8929 Other chronic pain: Secondary | ICD-10-CM | POA: Diagnosis not present

## 2018-09-30 DIAGNOSIS — M545 Low back pain, unspecified: Secondary | ICD-10-CM

## 2018-09-30 MED ORDER — VITAMIN D (ERGOCALCIFEROL) 1.25 MG (50000 UNIT) PO CAPS: 50000.0000 [IU] | ORAL_CAPSULE | ORAL | 11 refills | Status: DC

## 2018-09-30 MED ORDER — GABAPENTIN 300 MG PO CAPS: 600.0000 mg | ORAL_CAPSULE | Freq: Every evening | ORAL | 11 refills | Status: DC | PRN

## 2018-09-30 MED ORDER — ALPRAZOLAM 0.5 MG PO TABS: 0.5000 mg | ORAL_TABLET | Freq: Three times a day (TID) | ORAL | 5 refills | Status: DC | PRN

## 2018-09-30 NOTE — Progress Notes (Signed)
BP 108/66   Pulse 82   Ht 5\' 7"  (1.702 m)   Wt 183 lb 9.6 oz (83.3 kg)   LMP 11/21/2002   BMI 28.76 kg/m    Subjective:    Patient ID: Kelly Conley, female    DOB: Jul 20, 1961, 58 y.o.   MRN: [id number]  HPI: Kelly Conley is a 58 y.o. female presenting on 09/30/2018 for Hypothyroidism (6 month ); Hyperlipidemia; and Anxiety  This patient comes in for chronic recheck on her generalized anxiety and chronic back pain related to severe degenerative disc disease.  She still sees Dr. Channing Mutters at this time.  But at this time he is only giving her 60 capsules of hydrocodone.last 3 months.  She has been doing quite well and does not usually have very much pain at all.  The registry is checked and there are no red flags on her narcotic use.  We have continued with her anxiety medication.  She did had difficulty taking the Zoloft and the Topamax.  So these were stopped a few months ago.  NEW narcotic contract is reviewed and signed at today's visit.   Past Medical History:  Diagnosis Date  . Allergy   . Anemia   . Anxiety   . DDD (degenerative disc disease), lumbar   . Depression   . GERD (gastroesophageal reflux disease)   . Hypothyroidism   . Osteopenia   . Postmenopausal    Relevant past medical, surgical, family and social history reviewed and updated as indicated. Interim medical history since our last visit reviewed. Allergies and medications reviewed and updated. DATA REVIEWED: CHART IN EPIC  Family History reviewed for pertinent findings.  Review of Systems  Constitutional: Negative.   HENT: Negative.   Eyes: Negative.   Respiratory: Negative.   Gastrointestinal: Negative.   Genitourinary: Negative.     Allergies as of 09/30/2018      Reactions   Codeine Other (See Comments)   Headache   Sulfa Antibiotics Rash      Medication List       Accurate as of September 30, 2018 10:29 AM. Always use your most recent med list.        ALPRAZolam 0.5 MG  tablet Commonly known as:  XANAX Take 1 tablet (0.5 mg total) by mouth 3 (three) times daily as needed for anxiety.   cetirizine 10 MG tablet Commonly known as:  ZYRTEC Take 10 mg by mouth daily.   ezetimibe-simvastatin 10-20 MG tablet Commonly known as:  VYTORIN Take 1 tablet by mouth daily.   fish oil-omega-3 fatty acids 1000 MG capsule Take 2 g by mouth daily.   gabapentin 300 MG capsule Commonly known as:  NEURONTIN Take 2 capsules (600 mg total) by mouth at bedtime as needed.   HYDROcodone-acetaminophen 5-325 MG tablet Commonly known as:  NORCO/VICODIN Take 1 tablet by mouth every 8 (eight) hours as needed.   levothyroxine 25 MCG tablet Commonly known as:  SYNTHROID, LEVOTHROID Take 25 mcg by mouth daily before breakfast.   Vitamin D (Ergocalciferol) 1.25 MG (50000 UT) Caps capsule Commonly known as:  DRISDOL Take 1 capsule (50,000 Units total) by mouth 2 (two) times a week. Start taking on:  October 02, 2018          Objective:    BP 108/66   Pulse 82   Ht 5\' 7"  (1.702 m)   Wt 183 lb 9.6 oz (83.3 kg)   LMP 11/21/2002   BMI 28.76 kg/m  Allergies  Allergen Reactions  . Codeine Other (See Comments)    Headache  . Sulfa Antibiotics Rash    Wt Readings from Last 3 Encounters:  09/30/18 183 lb 9.6 oz (83.3 kg)  04/09/18 187 lb (84.8 kg)  10/02/17 190 lb 3.2 oz (86.3 kg)    Physical Exam Constitutional:      Appearance: She is well-developed.  HENT:     Head: Normocephalic and atraumatic.  Eyes:     Conjunctiva/sclera: Conjunctivae normal.     Pupils: Pupils are equal, round, and reactive to light.  Cardiovascular:     Rate and Rhythm: Normal rate and regular rhythm.     Heart sounds: Normal heart sounds.  Pulmonary:     Effort: Pulmonary effort is normal.     Breath sounds: Normal breath sounds.  Abdominal:     General: Bowel sounds are normal.     Palpations: Abdomen is soft.  Skin:    General: Skin is warm and dry.     Findings: No rash.   Neurological:     Mental Status: She is alert and oriented to person, place, and time.     Deep Tendon Reflexes: Reflexes are normal and symmetric.  Psychiatric:        Behavior: Behavior normal.        Thought Content: Thought content normal.        Judgment: Judgment normal.     Results for orders placed or performed in visit on 12/28/16  Rapid strep screen (not at [facility])  Result Value Ref Range   Strep Gp A Ag, IA W/Reflex Negative Negative  Culture, Group A Strep  Result Value Ref Range   Strep A Culture CANCELED       Assessment & Plan:   1. Anxiety - ALPRAZolam (XANAX) 0.5 MG tablet; Take 1 tablet (0.5 mg total) by mouth 3 (three) times daily as needed for anxiety.  Dispense: 90 tablet; Refill: 5  2. Menopausal and postmenopausal disorder - gabapentin (NEURONTIN) 300 MG capsule; Take 2 capsules (600 mg total) by mouth at bedtime as needed.  Dispense: 60 capsule; Refill: 11  3. Chronic low back pain without sciatica, unspecified back pain laterality - gabapentin (NEURONTIN) 300 MG capsule; Take 2 capsules (600 mg total) by mouth at bedtime as needed.  Dispense: 60 capsule; Refill: 11   Continue all other maintenance medications as listed above.  Follow up plan: Return in about 4 months (around 02/02/2019).  Educational handout given for survey  Remus LofflerAngel S. Tyrion Glaude PA-C [facility] [address]  [place], [place] [address] [phone]   09/30/2018, 10:29 AM

## 2018-11-11 DIAGNOSIS — E559 Vitamin D deficiency, unspecified: Secondary | ICD-10-CM | POA: Insufficient documentation

## 2018-12-31 ENCOUNTER — Other Ambulatory Visit: Payer: Self-pay | Admitting: Family Medicine

## 2018-12-31 DIAGNOSIS — Z1231 Encounter for screening mammogram for malignant neoplasm of breast: Secondary | ICD-10-CM

## 2019-01-14 ENCOUNTER — Other Ambulatory Visit: Payer: Self-pay | Admitting: Surgery

## 2019-01-19 ENCOUNTER — Other Ambulatory Visit: Payer: Self-pay | Admitting: Surgery

## 2019-01-19 DIAGNOSIS — R1011 Right upper quadrant pain: Secondary | ICD-10-CM

## 2019-01-28 ENCOUNTER — Ambulatory Visit
Admission: RE | Admit: 2019-01-28 | Discharge: 2019-01-28 | Disposition: A | Discharge status: Home / Self Care | Payer: Commercial Managed Care - PPO | Source: Ambulatory Visit | Attending: Surgery | Admitting: Surgery

## 2019-01-28 DIAGNOSIS — R1011 Right upper quadrant pain: Secondary | ICD-10-CM

## 2019-01-29 ENCOUNTER — Other Ambulatory Visit (HOSPITAL_COMMUNITY): Payer: Self-pay | Admitting: Surgery

## 2019-01-29 DIAGNOSIS — R1011 Right upper quadrant pain: Secondary | ICD-10-CM

## 2019-01-30 ENCOUNTER — Telehealth: Payer: Self-pay | Admitting: Physician Assistant

## 2019-02-02 ENCOUNTER — Ambulatory Visit (INDEPENDENT_AMBULATORY_CARE_PROVIDER_SITE_OTHER): Payer: Commercial Managed Care - PPO | Admitting: Physician Assistant

## 2019-02-02 ENCOUNTER — Other Ambulatory Visit: Payer: Commercial Managed Care - PPO

## 2019-02-02 ENCOUNTER — Other Ambulatory Visit: Payer: Self-pay

## 2019-02-02 ENCOUNTER — Encounter: Payer: Self-pay | Admitting: Physician Assistant

## 2019-02-02 DIAGNOSIS — M51369 Other intervertebral disc degeneration, lumbar region without mention of lumbar back pain or lower extremity pain: Secondary | ICD-10-CM

## 2019-02-02 DIAGNOSIS — M545 Low back pain, unspecified: Secondary | ICD-10-CM

## 2019-02-02 DIAGNOSIS — M5136 Other intervertebral disc degeneration, lumbar region: Secondary | ICD-10-CM | POA: Diagnosis not present

## 2019-02-02 DIAGNOSIS — G8929 Other chronic pain: Secondary | ICD-10-CM

## 2019-02-02 DIAGNOSIS — M47816 Spondylosis without myelopathy or radiculopathy, lumbar region: Secondary | ICD-10-CM

## 2019-02-02 DIAGNOSIS — R1013 Epigastric pain: Secondary | ICD-10-CM | POA: Diagnosis not present

## 2019-02-02 MED ORDER — HYDROCODONE-ACETAMINOPHEN 5-325 MG PO TABS: 1.0000 | ORAL_TABLET | Freq: Three times a day (TID) | ORAL | 0 refills | Status: DC | PRN

## 2019-02-02 NOTE — Progress Notes (Signed)
Telephone visit  Subjective: CC: Back pain, history of surgery, spondylosis, epigastric pain PCP: Remus LofflerJones, Taurean Ju S, PA-C ZOX:[id number]:Kelly Conley is a 58 y.o. female calls for telephone consult today. Patient provides verbal consent for consult held via phone.  Patient is identified with 2 separate identifiers.  At this time the entire area is on COVID-19 social distancing and stay home orders are in place.  Patient is of higher risk and therefore we are performing this by a virtual method.  Location of patient: Home Location of provider: HOME Others present for call: None  The patient has had some issues with upper abdominal pain.  Her general surgeon is setting up an appointment for a scan.  Her ultrasound was normal.  But she has persistent epigastric pain and into the right side.  In the past she has had an H. pylori infection.  We will draw a lab test to see if there is any current infection.  Also she is coming to us for her very irregular use of pain medication due to her chronic back pain, degenerative disc disease, history of spinal fusion, lumbar spondylosis.  She had seen Dr. Channing Muttersoy for many years.  He is recently retired.  She generally only gets a couple of prescriptions filled every 6 months at the most.  I can look back in her PDMP and see that this is a correct history.  At work she had her labs performed and her triglycerides were just minimally.  Her A1c did reach 5.8.  She will bring the record by for us to have scanned into the chart.  She is working very hard on losing weight and is down about 22pounds this year.   ROS: Per HPI  Allergies  Allergen Reactions  . Codeine Other (See Comments)    Headache  . Sulfa Antibiotics Rash   Past Medical History:  Diagnosis Date  . Allergy   . Anemia   . Anxiety   . DDD (degenerative disc disease), lumbar   . Depression   . GERD (gastroesophageal reflux disease)   . Hypothyroidism   . Osteopenia   . Postmenopausal      Current Outpatient Medications:  .  ALPRAZolam (XANAX) 0.5 MG tablet, Take 1 tablet (0.5 mg total) by mouth 3 (three) times daily as needed for anxiety., Disp: 90 tablet, Rfl: 5 .  cetirizine (ZYRTEC) 10 MG tablet, Take 10 mg by mouth daily., Disp: , Rfl:  .  ezetimibe-simvastatin (VYTORIN) 10-20 MG tablet, Take 1 tablet by mouth daily., Disp: 90 tablet, Rfl: 11 .  fish oil-omega-3 fatty acids 1000 MG capsule, Take 2 g by mouth daily., Disp: , Rfl:  .  gabapentin (NEURONTIN) 300 MG capsule, Take 2 capsules (600 mg total) by mouth at bedtime as needed., Disp: 60 capsule, Rfl: 11 .  HYDROcodone-acetaminophen (NORCO) 5-325 MG tablet, Take 1 tablet by mouth every 8 (eight) hours as needed for moderate pain., Disp: 60 tablet, Rfl: 0 .  HYDROcodone-acetaminophen (NORCO/VICODIN) 5-325 MG tablet, Take 1 tablet by mouth every 8 (eight) hours as needed., Disp: 60 tablet, Rfl: 0 .  levothyroxine (SYNTHROID, LEVOTHROID) 25 MCG tablet, Take 25 mcg by mouth daily before breakfast. , Disp: , Rfl: 0 .  Vitamin D, Ergocalciferol, (DRISDOL) 1.25 MG (50000 UT) CAPS capsule, Take 1 capsule (50,000 Units total) by mouth 2 (two) times a week., Disp: 8 capsule, Rfl: 11  Assessment/ Plan: 58 y.o. female   1. Chronic low back pain without sciatica, unspecified back pain  laterality - HYDROcodone-acetaminophen (NORCO/VICODIN) 5-325 MG tablet; Take 1 tablet by mouth every 8 (eight) hours as needed.  Dispense: 60 tablet; Refill: 0 - HYDROcodone-acetaminophen (NORCO) 5-325 MG tablet; Take 1 tablet by mouth every 8 (eight) hours as needed for moderate pain.  Dispense: 60 tablet; Refill: 0  2. DDD (degenerative disc disease), lumbar - HYDROcodone-acetaminophen (NORCO/VICODIN) 5-325 MG tablet; Take 1 tablet by mouth every 8 (eight) hours as needed.  Dispense: 60 tablet; Refill: 0 - HYDROcodone-acetaminophen (NORCO) 5-325 MG tablet; Take 1 tablet by mouth every 8 (eight) hours as needed for moderate pain.  Dispense: 60 tablet;  Refill: 0  3. Epigastric pain - H Pylori, IGM, IGG, IGA AB  4. Lumbar spondylosis - HYDROcodone-acetaminophen (NORCO/VICODIN) 5-325 MG tablet; Take 1 tablet by mouth every 8 (eight) hours as needed.  Dispense: 60 tablet; Refill: 0 - HYDROcodone-acetaminophen (NORCO) 5-325 MG tablet; Take 1 tablet by mouth every 8 (eight) hours as needed for moderate pain.  Dispense: 60 tablet; Refill: 0   Continue all other maintenance medications as listed above.  Start time: 8:15 AM End time: 8:29 AM  Meds ordered this encounter  Medications  . HYDROcodone-acetaminophen (NORCO/VICODIN) 5-325 MG tablet    Sig: Take 1 tablet by mouth every 8 (eight) hours as needed.    Dispense:  60 tablet    Refill:  0    Order Specific Question:   Supervising Provider    Answer:   Janora Norlander [[id number]]  . HYDROcodone-acetaminophen (NORCO) 5-325 MG tablet    Sig: Take 1 tablet by mouth every 8 (eight) hours as needed for moderate pain.    Dispense:  60 tablet    Refill:  0    Fill in 90 from the original script    Order Specific Question:   Supervising Provider    Answer:   Janora Norlander [[id number]]    Particia Nearing PA-C [facility] [phone]

## 2019-02-03 LAB — H PYLORI, IGM, IGG, IGA AB
H pylori, IgM Abs: <9 units (ref 0.0–8.9)
H. pylori, IgA Abs: <9 units (ref 0.0–8.9)
H. pylori, IgG AbS: 6.51 Index Value — ABNORMAL HIGH (ref 0.00–0.79)

## 2019-02-09 ENCOUNTER — Other Ambulatory Visit: Payer: Self-pay

## 2019-02-09 ENCOUNTER — Ambulatory Visit (HOSPITAL_COMMUNITY)
Admission: RE | Admit: 2019-02-09 | Discharge: 2019-02-09 | Disposition: A | Discharge status: Home / Self Care | Payer: Commercial Managed Care - PPO | Source: Ambulatory Visit | Attending: Surgery | Admitting: Surgery

## 2019-02-09 DIAGNOSIS — R1011 Right upper quadrant pain: Secondary | ICD-10-CM | POA: Diagnosis not present

## 2019-02-09 MED ORDER — TECHNETIUM TC 99M MEBROFENIN IV KIT
5.0000 | PACK | Freq: Once | INTRAVENOUS | Status: AC | PRN
  Administered 2019-02-09: 5 via INTRAVENOUS

## 2019-03-24 ENCOUNTER — Other Ambulatory Visit: Payer: Self-pay | Admitting: Physician Assistant

## 2019-03-24 DIAGNOSIS — F419 Anxiety disorder, unspecified: Secondary | ICD-10-CM

## 2019-04-09 ENCOUNTER — Ambulatory Visit: Payer: Commercial Managed Care - PPO

## 2019-04-23 ENCOUNTER — Ambulatory Visit (INDEPENDENT_AMBULATORY_CARE_PROVIDER_SITE_OTHER): Payer: Commercial Managed Care - PPO | Admitting: Family Medicine

## 2019-04-23 ENCOUNTER — Other Ambulatory Visit: Payer: Self-pay

## 2019-04-23 ENCOUNTER — Encounter: Payer: Self-pay | Admitting: Family Medicine

## 2019-04-23 VITALS — BP 122/84 | HR 78 | Temp 98.2°F | Ht 67.0 in | Wt 175.4 lb

## 2019-04-23 DIAGNOSIS — M7712 Lateral epicondylitis, left elbow: Secondary | ICD-10-CM | POA: Diagnosis not present

## 2019-04-23 MED ORDER — METHYLPREDNISOLONE ACETATE 40 MG/ML IJ SUSP
40.0000 mg | Freq: Once | INTRAMUSCULAR | Status: AC
  Administered 2019-04-23: 40 mg via INTRAMUSCULAR

## 2019-04-23 NOTE — Progress Notes (Signed)
BP 122/84   Pulse 78   Temp 98.2 F (36.8 C) (Temporal)   Ht 5\' 7"  (1.702 m)   Wt 175 lb 6.4 oz (79.6 kg)   LMP 11/21/2002   BMI 27.47 kg/m    Subjective:   Patient ID: Kelly Conley, female    DOB: 03-28-1961, 58 y.o.   MRN: [id number]  HPI: Kelly Conley is a 58 y.o. female presenting on 04/23/2019 for Elbow Pain (left x 1 1/2 months)   HPI Left elbow pain Patient is coming in complaining of left elbow pain that hurts on the lateral aspect of her left elbow.  She says she is been using a lot of work to push and pull things and she feels like it is been bothering her a lot more.  She denies any numbness or weakness but just says that pain is on the left lateral up to her shoulder and sometimes shoots down to to her wrist but most of the time is on the lateral aspect of her elbow.  Tried some Advil without much relief.  Relevant past medical, surgical, family and social history reviewed and updated as indicated. Interim medical history since our last visit reviewed. Allergies and medications reviewed and updated.  Review of Systems  Constitutional: Negative for chills and fever.  Eyes: Negative for visual disturbance.  Respiratory: Negative for chest tightness and shortness of breath.   Cardiovascular: Negative for chest pain and leg swelling.  Musculoskeletal: Positive for arthralgias. Negative for back pain and gait problem.  Skin: Negative for rash.  Neurological: Negative for light-headedness and headaches.  Psychiatric/Behavioral: Negative for agitation and behavioral problems.  All other systems reviewed and are negative.   Per HPI unless specifically indicated above    Objective:   BP 122/84   Pulse 78   Temp 98.2 F (36.8 C) (Temporal)   Ht 5\' 7"  (1.702 m)   Wt 175 lb 6.4 oz (79.6 kg)   LMP 11/21/2002   BMI 27.47 kg/m   Wt Readings from Last 3 Encounters:  04/23/19 175 lb 6.4 oz (79.6 kg)  09/30/18 183 lb 9.6 oz (83.3 kg)  04/09/18 187 lb (84.8 kg)     Physical Exam Vitals signs and nursing note reviewed.  Constitutional:      General: She is not in acute distress.    Appearance: She is well-developed. She is not diaphoretic.  Eyes:     Conjunctiva/sclera: Conjunctivae normal.  Musculoskeletal: Normal range of motion.     Left elbow: She exhibits normal range of motion, no swelling, no effusion, no deformity and no laceration. Tenderness found. Lateral epicondyle tenderness noted.  Skin:    General: Skin is warm and dry.     Findings: No rash.  Neurological:     Mental Status: She is alert and oriented to person, place, and time.     Coordination: Coordination normal.  Psychiatric:        Behavior: Behavior normal.     Left tennis elbow injection: Consent form signed. Risk factors of bleeding and infection discussed with patient and patient is agreeable towards injection. Patient prepped with Betadine. Lateral approach towards injection used. Injected 40 mg of Depo-Medrol and 1 mL of 2% lidocaine. Patient tolerated procedure well and no side effects from noted. Minimal to no bleeding. Simple bandage applied after.   Assessment & Plan:   Problem List Items Addressed This Visit    None    Visit Diagnoses    Left tennis elbow    -  Primary   Relevant Medications   methylPREDNISolone acetate (DEPO-MEDROL) injection 40 mg (Completed)       Follow up plan: Return if symptoms worsen or fail to improve.  Counseling provided for all of the vaccine components No orders of the defined types were placed in this encounter.   Arville CareJoshua Glenville Espina, MD [facility] 04/23/2019, 4:37 PM

## 2019-05-18 ENCOUNTER — Telehealth: Payer: Self-pay | Admitting: Physician Assistant

## 2019-05-18 DIAGNOSIS — M7712 Lateral epicondylitis, left elbow: Secondary | ICD-10-CM

## 2019-05-18 NOTE — Telephone Encounter (Signed)
X-rays not in a show anything, x-ray can only show bones but is not in show the tendons or ligaments.  Do a referral to sports medicine or an orthopedic for her.

## 2019-05-18 NOTE — Telephone Encounter (Signed)
Patient was seen 9/3- does patient need to be seen again or can you just place a xray?

## 2019-05-18 NOTE — Telephone Encounter (Signed)
lmtcb

## 2019-05-18 NOTE — Telephone Encounter (Signed)
Patient aware- would like to go to ortho in [place]- referral placed.

## 2019-05-26 ENCOUNTER — Other Ambulatory Visit: Payer: Self-pay

## 2019-05-26 ENCOUNTER — Ambulatory Visit
Admission: RE | Admit: 2019-05-26 | Discharge: 2019-05-26 | Disposition: A | Discharge status: Home / Self Care | Payer: Commercial Managed Care - PPO | Source: Ambulatory Visit | Attending: Family Medicine | Admitting: Family Medicine

## 2019-05-26 DIAGNOSIS — Z1231 Encounter for screening mammogram for malignant neoplasm of breast: Secondary | ICD-10-CM

## 2019-05-27 ENCOUNTER — Ambulatory Visit: Payer: Self-pay

## 2019-05-27 ENCOUNTER — Encounter: Payer: Self-pay | Admitting: Orthopaedic Surgery

## 2019-05-27 ENCOUNTER — Ambulatory Visit (INDEPENDENT_AMBULATORY_CARE_PROVIDER_SITE_OTHER): Payer: Commercial Managed Care - PPO | Admitting: Orthopaedic Surgery

## 2019-05-27 ENCOUNTER — Ambulatory Visit: Payer: Commercial Managed Care - PPO | Admitting: Orthopaedic Surgery

## 2019-05-27 VITALS — BP 124/85 | HR 89 | Ht 67.0 in | Wt 173.0 lb

## 2019-05-27 DIAGNOSIS — M25522 Pain in left elbow: Secondary | ICD-10-CM

## 2019-05-27 MED ORDER — LIDOCAINE HCL 1 % IJ SOLN
1.0000 mL | INTRAMUSCULAR | Status: AC | PRN
  Administered 2019-05-27: 1 mL

## 2019-05-27 MED ORDER — METHYLPREDNISOLONE ACETATE 40 MG/ML IJ SUSP
20.0000 mg | INTRAMUSCULAR | Status: AC | PRN
  Administered 2019-05-27: 20 mg

## 2019-05-27 NOTE — Progress Notes (Signed)
Office Visit Note   Patient: Kelly Conley           Date of Birth: [birth year]           MRN: [id number] Visit Date: 05/27/2019              Requested by: Dettinger, Fransisca Kaufmann, MD [place],  [place] [address] PCP: Terald Sleeper, PA-C   Assessment & Plan: Visit Diagnoses:  1. Pain in left elbow     Plan: Symptoms and exam consistent with left tennis elbow.  Will inject area of point tenderness with Xylocaine and 20 mg of Depo-Medrol.  This provided complete relief of her pain.  We will give her a prescription for a tennis elbow splint, tennis elbow exercises and have her return over the next 3 to 4 weeks if no improvement.  Follow-Up Instructions: No follow-ups on file.   Orders:  Orders Placed This Encounter  Procedures  . XR Elbow 2 Views Left   No orders of the defined types were placed in this encounter.     Procedures: Hand/UE Inj for lateral epicondylitis on 05/27/2019 3:16 PM Details: 27 G needle, lateral approach Medications: 1 mL lidocaine 1 %; 20 mg methylPREDNISolone acetate 40 MG/ML      Clinical Data: No additional findings.   Subjective: Chief Complaint  Patient presents with  . Left Elbow - Follow-up  Patient presents today with left elbow pain. She said that it hurts if she squeezes anything. She is left hand so it affects everything. She received a cortisone injection 3 weeks ago and got no relief. She takes OTC tylenol for pain.   HPI  Review of Systems   Objective: Vital Signs: BP 124/85   Pulse 89   Ht 5\' 7"  (1.702 m)   Wt 173 lb (78.5 kg)   LMP 11/21/2002   BMI 27.10 kg/m   Physical Exam Constitutional:      Appearance: She is well-developed.  Eyes:     Pupils: Pupils are equal, round, and reactive to light.  Pulmonary:     Effort: Pulmonary effort is normal.  Skin:    General: Skin is warm and dry.  Neurological:     Mental Status: She is alert and oriented to person, place, and time.  Psychiatric:      Behavior: Behavior normal.     Ortho Exam left elbow with full range of motion in pronation supination flexion extension.  Local tenderness directly over the lateral epicondyle with more pain with grip to that area in extension and flexion.  No masses.  No pain over the olecranon or the radial head.  Skin intact.  No distal edema.  No wrist or forearm pain.  Neurologically intact  Specialty Comments:  No specialty comments available.  Imaging: No results found.   PMFS History: Patient Active Problem List   Diagnosis Date Noted  . Pain in left elbow 05/27/2019  . Anxiety 10/22/2016  . Hypothyroidism 10/22/2016  . Low back pain 04/28/2014  . Sleep disorder 04/28/2014  . Menopausal and postmenopausal disorder 04/28/2014  . Hypertriglyceridemia 04/28/2014   Past Medical History:  Diagnosis Date  . Allergy   . Anemia   . Anxiety   . DDD (degenerative disc disease), lumbar   . Depression   . GERD (gastroesophageal reflux disease)   . Hypothyroidism   . Osteopenia   . Postmenopausal     Family History  Problem Relation Age of Onset  . Hypertension  Mother   . Depression Mother   . Diabetes Father   . Hypertension Father   . Hyperlipidemia Father   . Hypertension Brother   . Hyperlipidemia Brother   . Hypertension Brother   . Hyperlipidemia Brother     Past Surgical History:  Procedure Laterality Date  . ABDOMINAL HYSTERECTOMY    . BACK SURGERY     Social History   Occupational History  . Not on file  Tobacco Use  . Smoking status: Former Smoker    Packs/day: 1.00    Years: 3.00    Pack years: 3.00    Types: Cigarettes    Quit date: 08/20/2004    Years since quitting: 14.7  . Smokeless tobacco: Never Used  Substance and Sexual Activity  . Alcohol use: No  . Drug use: No  . Sexual activity: Not on file

## 2019-06-24 ENCOUNTER — Other Ambulatory Visit: Payer: Self-pay | Admitting: Physician Assistant

## 2019-06-24 DIAGNOSIS — F419 Anxiety disorder, unspecified: Secondary | ICD-10-CM

## 2019-08-04 ENCOUNTER — Ambulatory Visit (INDEPENDENT_AMBULATORY_CARE_PROVIDER_SITE_OTHER): Payer: Commercial Managed Care - PPO | Admitting: Physician Assistant

## 2019-08-04 ENCOUNTER — Other Ambulatory Visit: Payer: Self-pay

## 2019-08-04 ENCOUNTER — Encounter: Payer: Self-pay | Admitting: Physician Assistant

## 2019-08-04 VITALS — BP 108/75 | HR 77 | Temp 97.0°F | Ht 67.0 in | Wt 172.8 lb

## 2019-08-04 DIAGNOSIS — F419 Anxiety disorder, unspecified: Secondary | ICD-10-CM | POA: Diagnosis not present

## 2019-08-04 DIAGNOSIS — Z79899 Other long term (current) drug therapy: Secondary | ICD-10-CM | POA: Diagnosis not present

## 2019-08-04 DIAGNOSIS — K5904 Chronic idiopathic constipation: Secondary | ICD-10-CM

## 2019-08-04 DIAGNOSIS — M5136 Other intervertebral disc degeneration, lumbar region: Secondary | ICD-10-CM | POA: Diagnosis not present

## 2019-08-04 DIAGNOSIS — M47816 Spondylosis without myelopathy or radiculopathy, lumbar region: Secondary | ICD-10-CM

## 2019-08-04 DIAGNOSIS — G8929 Other chronic pain: Secondary | ICD-10-CM

## 2019-08-04 DIAGNOSIS — M545 Low back pain, unspecified: Secondary | ICD-10-CM

## 2019-08-04 DIAGNOSIS — M51369 Other intervertebral disc degeneration, lumbar region without mention of lumbar back pain or lower extremity pain: Secondary | ICD-10-CM

## 2019-08-04 MED ORDER — HYDROCODONE-ACETAMINOPHEN 5-325 MG PO TABS: 1.0000 | ORAL_TABLET | Freq: Three times a day (TID) | ORAL | 0 refills | Status: DC | PRN

## 2019-08-04 MED ORDER — ALPRAZOLAM 0.5 MG PO TABS
ORAL_TABLET | ORAL | 5 refills | Status: DC
Start: 2019-08-04 — End: 2020-02-04

## 2019-08-04 NOTE — Patient Instructions (Signed)

## 2019-08-07 LAB — TOXASSURE SELECT 13 (MW), URINE

## 2019-08-10 NOTE — Progress Notes (Signed)
Acute Office Visit  Subjective:    Patient ID: Kelly Conley, female    DOB: 07/17/1961, 58 y.o.   MRN: [id number]  Chief Complaint  Patient presents with  . Anxiety    6 month follow up   . Pain    Anxiety Presents for follow-up visit. Symptoms include decreased concentration, depressed mood, excessive worry, irritability and nervous/anxious behavior. Patient reports no chest pain. Symptoms occur most days. The severity of symptoms is moderate. The quality of sleep is fair. Nighttime awakenings: none.    Back Pain This is a chronic problem. The current episode started more than 1 year ago. The problem occurs intermittently. The problem has been waxing and waning since onset. The pain is present in the lumbar spine. The quality of the pain is described as burning. The symptoms are aggravated by twisting. Pertinent negatives include no abdominal pain, chest pain, dysuria or fever. She has tried NSAIDs, heat and analgesics for the symptoms. The treatment provided significant relief.  Thyroid Problem Presents for follow-up visit. Symptoms include anxiety and depressed mood. Patient reports no fatigue. The symptoms have been stable.     Past Medical History:  Diagnosis Date  . Allergy   . Anemia   . Anxiety   . DDD (degenerative disc disease), lumbar   . Depression   . GERD (gastroesophageal reflux disease)   . Hypothyroidism   . Osteopenia   . Postmenopausal     Past Surgical History:  Procedure Laterality Date  . ABDOMINAL HYSTERECTOMY    . BACK SURGERY      Family History  Problem Relation Age of Onset  . Hypertension Mother   . Depression Mother   . Diabetes Father   . Hypertension Father   . Hyperlipidemia Father   . Hypertension Brother   . Hyperlipidemia Brother   . Hypertension Brother   . Hyperlipidemia Brother     Social History   Socioeconomic History  . Marital status: Married    Spouse name: Not on file  . Number of children: Not on file  .  Years of education: Not on file  . Highest education level: Not on file  Occupational History  . Not on file  Tobacco Use  . Smoking status: Former Smoker    Packs/day: 1.00    Years: 3.00    Pack years: 3.00    Types: Cigarettes    Quit date: 08/20/2004    Years since quitting: 14.9  . Smokeless tobacco: Never Used  Substance and Sexual Activity  . Alcohol use: No  . Drug use: No  . Sexual activity: Not on file  Other Topics Concern  . Not on file  Social History Narrative  . Not on file   Social Determinants of Health   Financial Resource Strain:   . Difficulty of Paying Living Expenses: Not on file  Food Insecurity:   . Worried About Programme researcher, broadcasting/film/video in the Last Year: Not on file  . Ran Out of Food in the Last Year: Not on file  Transportation Needs:   . Lack of Transportation (Medical): Not on file  . Lack of Transportation (Non-Medical): Not on file  Physical Activity:   . Days of Exercise per Week: Not on file  . Minutes of Exercise per Session: Not on file  Stress:   . Feeling of Stress : Not on file  Social Connections:   . Frequency of Communication with Friends and Family: Not on file  . Frequency  of Social Gatherings with Friends and Family: Not on file  . Attends Religious Services: Not on file  . Active Member of Clubs or Organizations: Not on file  . Attends BankerClub or Organization Meetings: Not on file  . Marital Status: Not on file  Intimate Partner Violence:   . Fear of Current or Ex-Partner: Not on file  . Emotionally Abused: Not on file  . Physically Abused: Not on file  . Sexually Abused: Not on file    Outpatient Medications Prior to Visit  Medication Sig Dispense Refill  . cetirizine (ZYRTEC) 10 MG tablet Take 10 mg by mouth daily.    Marland Kitchen. ezetimibe-simvastatin (VYTORIN) 10-20 MG tablet Take 1 tablet by mouth daily. 90 tablet 11  . fish oil-omega-3 fatty acids 1000 MG capsule Take 2 g by mouth daily.    Marland Kitchen. gabapentin (NEURONTIN) 300 MG capsule  Take 2 capsules (600 mg total) by mouth at bedtime as needed. 60 capsule 11  . levothyroxine (SYNTHROID, LEVOTHROID) 25 MCG tablet Take 25 mcg by mouth daily before breakfast.   0  . LINZESS 145 MCG CAPS capsule     . ALPRAZolam (XANAX) 0.5 MG tablet TAKE 1 TABLET 3 TIMES DAILY AS NEEDED FOR ANXIETY 90 tablet 0  . HYDROcodone-acetaminophen (NORCO) 5-325 MG tablet Take 1 tablet by mouth every 8 (eight) hours as needed for moderate pain. 60 tablet 0  . HYDROcodone-acetaminophen (NORCO/VICODIN) 5-325 MG tablet Take 1 tablet by mouth every 8 (eight) hours as needed. 60 tablet 0   No facility-administered medications prior to visit.    Allergies  Allergen Reactions  . Codeine Other (See Comments)    Headache  . Sulfa Antibiotics Rash    Review of Systems  Constitutional: Positive for irritability. Negative for activity change, fatigue and fever.  HENT: Negative.   Eyes: Negative.   Respiratory: Negative.  Negative for cough.   Cardiovascular: Negative.  Negative for chest pain.  Gastrointestinal: Negative.  Negative for abdominal pain.  Endocrine: Negative.   Genitourinary: Negative.  Negative for dysuria.  Musculoskeletal: Positive for back pain.  Skin: Negative.   Neurological: Negative.   Psychiatric/Behavioral: Positive for decreased concentration. The patient is nervous/anxious.        Objective:    Physical Exam Constitutional:      General: She is not in acute distress.    Appearance: Normal appearance. She is well-developed.  HENT:     Head: Normocephalic and atraumatic.  Cardiovascular:     Rate and Rhythm: Normal rate.  Pulmonary:     Effort: Pulmonary effort is normal.  Skin:    General: Skin is warm and dry.     Findings: No rash.  Neurological:     Mental Status: She is alert and oriented to person, place, and time.     Deep Tendon Reflexes: Reflexes are normal and symmetric.     BP 108/75   Pulse 77   Temp (!) 97 F (36.1 C) (Temporal)   Ht 5\' 7"   (1.702 m)   Wt 172 lb 12.8 oz (78.4 kg)   LMP 11/21/2002   SpO2 96%   BMI 27.06 kg/m  Wt Readings from Last 3 Encounters:  08/04/19 172 lb 12.8 oz (78.4 kg)  05/27/19 173 lb (78.5 kg)  04/23/19 175 lb 6.4 oz (79.6 kg)    Health Maintenance Due  Topic Date Due  . Hepatitis C Screening  1960/10/01  . HIV Screening  05/07/1975  . TETANUS/TDAP  04/08/2016  . PAP SMEAR-Modifier  03/17/2019    There are no preventive care reminders to display for this patient.   No results found for: TSH Lab Results  Component Value Date   WBC 6.6 11/20/2012   HGB 11.8 (A) 11/20/2012   HCT 35.6 (A) 11/20/2012   MCV 82.6 11/20/2012   PLT 362 08/06/2007   Lab Results  Component Value Date   NA 141 11/20/2012   K 4.3 11/20/2012   CO2 29 11/20/2012   GLUCOSE 82 11/20/2012   BUN 21 11/20/2012   CREATININE 0.92 11/20/2012   BILITOT 0.4 11/20/2012   ALKPHOS 60 11/20/2012   AST 21 11/20/2012   ALT 19 11/20/2012   PROT 6.7 11/20/2012   ALBUMIN 4.9 11/20/2012   CALCIUM 9.8 11/20/2012   No results found for: CHOL No results found for: HDL No results found for: LDLCALC No results found for: TRIG No results found for: CHOLHDL No results found for: HGBA1C     Assessment & Plan:   Problem List Items Addressed This Visit      Other   Low back pain   Relevant Medications   HYDROcodone-acetaminophen (NORCO) 5-325 MG tablet   HYDROcodone-acetaminophen (NORCO/VICODIN) 5-325 MG tablet   Anxiety   Relevant Medications   ALPRAZolam (XANAX) 0.5 MG tablet    Other Visit Diagnoses    Controlled substance agreement signed    -  Primary   Relevant Orders   DRUG SCREEN-TOXASSURE (Completed)   DDD (degenerative disc disease), lumbar       Relevant Medications   HYDROcodone-acetaminophen (NORCO) 5-325 MG tablet   HYDROcodone-acetaminophen (NORCO/VICODIN) 5-325 MG tablet   Lumbar spondylosis       Relevant Medications   HYDROcodone-acetaminophen (NORCO) 5-325 MG tablet    HYDROcodone-acetaminophen (NORCO/VICODIN) 5-325 MG tablet   Chronic idiopathic constipation       Relevant Medications   LINZESS 145 MCG CAPS capsule       Meds ordered this encounter  Medications  . ALPRAZolam (XANAX) 0.5 MG tablet    Sig: TAKE 1 TABLET 3 TIMES DAILY AS NEEDED FOR ANXIETY    Dispense:  90 tablet    Refill:  5    Order Specific Question:   Supervising Provider    Answer:   Janora Norlander [[id number]]  . HYDROcodone-acetaminophen (NORCO) 5-325 MG tablet    Sig: Take 1 tablet by mouth every 8 (eight) hours as needed for moderate pain.    Dispense:  60 tablet    Refill:  0    Fill in 90 from the original script    Order Specific Question:   Supervising Provider    Answer:   Janora Norlander [[id number]]  . HYDROcodone-acetaminophen (NORCO/VICODIN) 5-325 MG tablet    Sig: Take 1 tablet by mouth every 8 (eight) hours as needed.    Dispense:  60 tablet    Refill:  0    Order Specific Question:   Supervising Provider    Answer:   Janora Norlander [[id number]]     Terald Sleeper, PA-C

## 2020-02-03 ENCOUNTER — Ambulatory Visit: Payer: Commercial Managed Care - PPO | Admitting: Physician Assistant

## 2020-02-04 ENCOUNTER — Encounter: Payer: Self-pay | Admitting: Family Medicine

## 2020-02-04 ENCOUNTER — Ambulatory Visit (INDEPENDENT_AMBULATORY_CARE_PROVIDER_SITE_OTHER): Payer: Commercial Managed Care - PPO | Admitting: Family Medicine

## 2020-02-04 ENCOUNTER — Other Ambulatory Visit: Payer: Self-pay

## 2020-02-04 ENCOUNTER — Ambulatory Visit: Payer: Commercial Managed Care - PPO | Admitting: Physician Assistant

## 2020-02-04 VITALS — BP 108/76 | HR 86 | Temp 97.5°F | Ht 67.0 in | Wt 170.6 lb

## 2020-02-04 DIAGNOSIS — M5136 Other intervertebral disc degeneration, lumbar region: Secondary | ICD-10-CM | POA: Diagnosis not present

## 2020-02-04 DIAGNOSIS — Z114 Encounter for screening for human immunodeficiency virus [HIV]: Secondary | ICD-10-CM

## 2020-02-04 DIAGNOSIS — F411 Generalized anxiety disorder: Secondary | ICD-10-CM | POA: Insufficient documentation

## 2020-02-04 DIAGNOSIS — M545 Low back pain, unspecified: Secondary | ICD-10-CM

## 2020-02-04 DIAGNOSIS — Z79899 Other long term (current) drug therapy: Secondary | ICD-10-CM | POA: Diagnosis not present

## 2020-02-04 DIAGNOSIS — F419 Anxiety disorder, unspecified: Secondary | ICD-10-CM

## 2020-02-04 DIAGNOSIS — G8929 Other chronic pain: Secondary | ICD-10-CM

## 2020-02-04 DIAGNOSIS — E039 Hypothyroidism, unspecified: Secondary | ICD-10-CM

## 2020-02-04 DIAGNOSIS — M47816 Spondylosis without myelopathy or radiculopathy, lumbar region: Secondary | ICD-10-CM | POA: Diagnosis not present

## 2020-02-04 DIAGNOSIS — G479 Sleep disorder, unspecified: Secondary | ICD-10-CM

## 2020-02-04 DIAGNOSIS — Z1159 Encounter for screening for other viral diseases: Secondary | ICD-10-CM

## 2020-02-04 MED ORDER — ALPRAZOLAM 0.5 MG PO TABS: ORAL_TABLET | ORAL | 5 refills | Status: DC

## 2020-02-04 MED ORDER — HYDROCODONE-ACETAMINOPHEN 5-325 MG PO TABS: 1.0000 | ORAL_TABLET | Freq: Every day | ORAL | 0 refills | Status: DC | PRN

## 2020-02-04 NOTE — Progress Notes (Signed)
Subjective:  Patient ID: Kelly Conley, female    DOB: 31-May-1961  Age: 59 y.o. MRN: [id number]  CC: Follow-up (6 month, Previous AJ pt )   HPI CLAIRESSA BOULET presents for follow-up of elevated cholesterol. Doing well without complaints on current medication. Denies side effects of statin including myalgia and arthralgia and nausea. Currently no chest pain, shortness of breath or other cardiovascular related symptoms noted.  Pt. Had spinal fusion 14 years ago. She had residual pain after.  She uses tylenol usually, but will need 1/2 to  1 tab of hydrocodone 5/325 2-3 times weekly for flares of pain. This is usually on rainy days or when she etravels.   Pt. Has been taking aprazolam for her anxiety for 20 years TID. Her anxiety stems from her job, son & husband's illnesses. We reviewed a CSA today and she has signed.   Pt. Has hypothyroidism, but is managed by her endocrinologist for this.   GAD 7 : Generalized Anxiety Score 02/04/2020 08/04/2019  Nervous, Anxious, on Edge 1 1  Control/stop worrying 0 1  Worry too much - different things 3 1  Trouble relaxing 0 0  Restless 0 0  Easily annoyed or irritable 1 1  Afraid - awful might happen 0 0  Total GAD 7 Score 5 4  Anxiety Difficulty Somewhat difficult Not difficult at all    Depression screen [facility] 2/9 02/04/2020 08/04/2019 04/23/2019  Decreased Interest 0 0 0  Down, Depressed, Hopeless 0 1 0  PHQ - 2 Score 0 1 0  Altered sleeping - 0 -  Tired, decreased energy - 0 -  Change in appetite - 0 -  Feeling bad or failure about yourself  - 0 -  Trouble concentrating - 0 -  Moving slowly or fidgety/restless - 0 -  Suicidal thoughts - 0 -  PHQ-9 Score - 1 -  Difficult doing work/chores - Not difficult at all -    History Maragret has a past medical history of Allergy, Anemia, Anxiety, DDD (degenerative disc disease), lumbar, Depression, GERD (gastroesophageal reflux disease), Hypothyroidism, Osteopenia, and Postmenopausal.   She has  a past surgical history that includes Abdominal hysterectomy and Back surgery.   Her family history includes Depression in her mother; Diabetes in her father; Hyperlipidemia in her brother, brother, and father; Hypertension in her brother, brother, father, and mother.She reports that she quit smoking about 15 years ago. Her smoking use included cigarettes. She has a 3.00 pack-year smoking history. She has never used smokeless tobacco. She reports that she does not drink alcohol and does not use drugs.    ROS Review of Systems  Constitutional: Negative.   HENT: Negative.   Eyes: Negative for visual disturbance.  Respiratory: Negative for shortness of breath.   Cardiovascular: Negative for chest pain.  Gastrointestinal: Negative for abdominal pain.  Musculoskeletal: Positive for arthralgias and back pain.  Psychiatric/Behavioral: The patient is nervous/anxious.     Objective:  BP 108/76   Pulse 86   Temp (!) 97.5 F (36.4 C) (Temporal)   Ht 5\' 7"  (1.702 m)   Wt 170 lb 9.6 oz (77.4 kg)   LMP 11/21/2002   BMI 26.72 kg/m   BP Readings from Last 3 Encounters:  02/04/20 108/76  08/04/19 108/75  05/27/19 124/85    Wt Readings from Last 3 Encounters:  02/04/20 170 lb 9.6 oz (77.4 kg)  08/04/19 172 lb 12.8 oz (78.4 kg)  05/27/19 173 lb (78.5 kg)     Physical  Exam Constitutional:      General: She is not in acute distress.    Appearance: She is well-developed.  HENT:     Head: Normocephalic and atraumatic.  Eyes:     Conjunctiva/sclera: Conjunctivae normal.     Pupils: Pupils are equal, round, and reactive to light.  Neck:     Thyroid: No thyromegaly.  Cardiovascular:     Rate and Rhythm: Normal rate and regular rhythm.     Heart sounds: Normal heart sounds. No murmur heard.   Pulmonary:     Effort: Pulmonary effort is normal. No respiratory distress.     Breath sounds: Normal breath sounds. No wheezing or rales.  Abdominal:     General: Bowel sounds are normal.  There is no distension.     Palpations: Abdomen is soft.     Tenderness: There is no abdominal tenderness.  Musculoskeletal:        General: Normal range of motion.     Cervical back: Normal range of motion and neck supple.  Lymphadenopathy:     Cervical: No cervical adenopathy.  Skin:    General: Skin is warm and dry.  Neurological:     Mental Status: She is alert and oriented to person, place, and time.  Psychiatric:        Behavior: Behavior normal.        Thought Content: Thought content normal.        Judgment: Judgment normal.       Assessment & Plan:   Viva was seen today for follow-up.  Diagnoses and all orders for this visit:  Controlled substance agreement signed -     ToxASSURE Select 13 (MW), Urine  Lumbar spondylosis -     ToxASSURE Select 13 (MW), Urine -     HYDROcodone-acetaminophen (NORCO/VICODIN) 5-325 MG tablet; Take 1 tablet by mouth daily as needed for moderate pain or severe pain.  Chronic low back pain without sciatica, unspecified back pain laterality -     ToxASSURE Select 13 (MW), Urine -     HYDROcodone-acetaminophen (NORCO/VICODIN) 5-325 MG tablet; Take 1 tablet by mouth daily as needed for moderate pain or severe pain.  DDD (degenerative disc disease), lumbar -     HYDROcodone-acetaminophen (NORCO/VICODIN) 5-325 MG tablet; Take 1 tablet by mouth daily as needed for moderate pain or severe pain.  Hypothyroidism, unspecified type  Sleep disorder  GAD (generalized anxiety disorder) -     ToxASSURE Select 13 (MW), Urine  Need for hepatitis C screening test -     Hepatitis C antibody  Encounter for screening for HIV -     HIV Antibody (routine testing w rflx)  Anxiety -     ALPRAZolam (XANAX) 0.5 MG tablet; TAKE 1 TABLET 3 TIMES DAILY AS NEEDED FOR ANXIETY     I have discontinued Marylu Lund L. Douthat's HYDROcodone-acetaminophen. I have also changed her HYDROcodone-acetaminophen. Additionally, I am having her maintain her fish  oil-omega-3 fatty acids, cetirizine, levothyroxine, ezetimibe-simvastatin, gabapentin, Linzess, and ALPRAZolam.  Allergies as of 02/04/2020      Reactions   Codeine Other (See Comments)   Headache   Sulfa Antibiotics Rash      Medication List       Accurate as of February 04, 2020 10:22 PM. If you have any questions, ask your nurse or doctor.        ALPRAZolam 0.5 MG tablet Commonly known as: XANAX TAKE 1 TABLET 3 TIMES DAILY AS NEEDED FOR ANXIETY   cetirizine 10 MG  tablet Commonly known as: ZYRTEC Take 10 mg by mouth daily.   ezetimibe-simvastatin 10-20 MG tablet Commonly known as: VYTORIN Take 1 tablet by mouth daily.   fish oil-omega-3 fatty acids 1000 MG capsule Take 2 g by mouth daily.   gabapentin 300 MG capsule Commonly known as: NEURONTIN Take 2 capsules (600 mg total) by mouth at bedtime as needed.   HYDROcodone-acetaminophen 5-325 MG tablet Commonly known as: NORCO/VICODIN Take 1 tablet by mouth daily as needed for moderate pain or severe pain. What changed:   when to take this  reasons to take this  Another medication with the same name was removed. Continue taking this medication, and follow the directions you see here. Changed by: Claretta Fraise, MD   levothyroxine 25 MCG tablet Commonly known as: SYNTHROID Take 25 mcg by mouth daily before breakfast.   Linzess 145 MCG Caps capsule Generic drug: linaclotide        Follow-up: Return in about 3 months (around 05/06/2020) for Anxiety, Pain.  Claretta Fraise, M.D.

## 2020-02-05 LAB — HIV ANTIBODY (ROUTINE TESTING W REFLEX): HIV Screen 4th Generation wRfx: NONREACTIVE

## 2020-02-05 LAB — HEPATITIS C ANTIBODY: Hep C Virus Ab: <0.1 s/co ratio (ref 0.0–0.9)

## 2020-02-09 LAB — TOXASSURE SELECT 13 (MW), URINE

## 2020-02-17 ENCOUNTER — Ambulatory Visit: Payer: Self-pay

## 2020-02-17 ENCOUNTER — Other Ambulatory Visit: Payer: Self-pay

## 2020-02-17 ENCOUNTER — Encounter: Payer: Self-pay | Admitting: Orthopaedic Surgery

## 2020-02-17 ENCOUNTER — Ambulatory Visit (INDEPENDENT_AMBULATORY_CARE_PROVIDER_SITE_OTHER): Payer: Commercial Managed Care - PPO | Admitting: Orthopaedic Surgery

## 2020-02-17 VITALS — Ht 67.0 in | Wt 172.0 lb

## 2020-02-17 DIAGNOSIS — G8929 Other chronic pain: Secondary | ICD-10-CM

## 2020-02-17 DIAGNOSIS — M25511 Pain in right shoulder: Secondary | ICD-10-CM | POA: Diagnosis not present

## 2020-02-17 DIAGNOSIS — M7541 Impingement syndrome of right shoulder: Secondary | ICD-10-CM | POA: Insufficient documentation

## 2020-02-17 MED ORDER — BUPIVACAINE HCL 0.5 % IJ SOLN
2.0000 mL | INTRAMUSCULAR | Status: AC | PRN
  Administered 2020-02-17: 2 mL via INTRA_ARTICULAR

## 2020-02-17 MED ORDER — LIDOCAINE HCL 2 % IJ SOLN
2.0000 mL | INTRAMUSCULAR | Status: AC | PRN
  Administered 2020-02-17: 2 mL

## 2020-02-17 MED ORDER — METHYLPREDNISOLONE ACETATE 40 MG/ML IJ SUSP
80.0000 mg | INTRAMUSCULAR | Status: AC | PRN
  Administered 2020-02-17: 80 mg via INTRA_ARTICULAR

## 2020-02-17 NOTE — Progress Notes (Signed)
Office Visit Note   Patient: Kelly Conley           Date of Birth: [birth year]           MRN: [id number] Visit Date: 02/17/2020              Requested by: Mechele Claude, MD [address] [place],  [place] [address] PCP: Mechele Claude, MD   Assessment & Plan: Visit Diagnoses:  1. Chronic right shoulder pain   2. Impingement syndrome of right shoulder     Plan: Impingement syndrome right shoulder.  No evidence of adhesive capsulitis.  Will inject the subacromial space with cortisone and monitor response.  Suspect this is just mild rotator cuff tendinitis or bursitis.  Consider MRI scan in the future if problem persists  Follow-Up Instructions: Return if symptoms worsen or fail to improve.   Orders:  Orders Placed This Encounter  Procedures  . Large Joint Inj: R subacromial bursa  . XR Shoulder Right   No orders of the defined types were placed in this encounter.     Procedures: Large Joint Inj: R subacromial bursa on 02/17/2020 3:47 PM Indications: pain and diagnostic evaluation Details: 25 G 1.5 in needle, anterolateral approach  Arthrogram: No  Medications: 2 mL lidocaine 2 %; 2 mL bupivacaine 0.5 %; 80 mg methylPREDNISolone acetate 40 MG/ML Consent was given by the patient. Immediately prior to procedure a time out was called to verify the correct patient, procedure, equipment, support staff and site/side marked as required. Patient was prepped and draped in the usual sterile fashion.       Clinical Data: No additional findings.   Subjective: Chief Complaint  Patient presents with  . Right Shoulder - Pain  Patient presents with right shoulder pain. She states that it seems to hurt the most when she is on the phone at work. She does not notice that it hurts as much at home. She does state that work is going to try and get a headset for her to use. She is using the heating pad, voltaren gel and creams which seem to help.  HPI  Review of  Systems   Objective: Vital Signs: Ht 5\' 7"  (1.702 m)   Wt 172 lb (78 kg)   LMP 11/21/2002   BMI 26.94 kg/m   Physical Exam Constitutional:      Appearance: She is well-developed.  Eyes:     Pupils: Pupils are equal, round, and reactive to light.  Pulmonary:     Effort: Pulmonary effort is normal.  Skin:    General: Skin is warm and dry.  Neurological:     Mental Status: She is alert and oriented to person, place, and time.  Psychiatric:        Behavior: Behavior normal.     Ortho Exam awake alert and oriented x3.  Comfortable sitting.  Some pain with overhead activity and motion of her right shoulder.  Minimally positive impingement testing.  Negative empty can testing and mildly positive Speed sign.  No loss of motion.  Skin intact.  Very mild subacromial pain anteriorly and laterally.  No pain at the [facility] joint.  Good grip and release  Specialty Comments:  No specialty comments available.  Imaging: XR Shoulder Right  Result Date: 02/17/2020 Films of the right shoulder obtained in several projections.  The humeral head is centered about the glenoid.  Normal space between the humeral head and the acromium.  Flat acromion.  Some early degenerative changes  the [facility] joint.  No ectopic calcification.  No evidence of acute injury or obvious glenohumeral arthritis    PMFS History: Patient Active Problem List   Diagnosis Date Noted  . Impingement syndrome of right shoulder 02/17/2020  . GAD (generalized anxiety disorder) 02/04/2020  . Pain in left elbow 05/27/2019  . Vitamin D deficiency 11/11/2018  . Anxiety 10/22/2016  . Hypothyroidism 10/22/2016  . Low back pain 04/28/2014  . Sleep disorder 04/28/2014  . Menopausal and postmenopausal disorder 04/28/2014  . Hypertriglyceridemia 04/28/2014  . Menopausal problem 08/21/2011   Past Medical History:  Diagnosis Date  . Allergy   . Anemia   . Anxiety   . DDD (degenerative disc disease), lumbar   . Depression   . GERD  (gastroesophageal reflux disease)   . Hypothyroidism   . Osteopenia   . Postmenopausal     Family History  Problem Relation Age of Onset  . Hypertension Mother   . Depression Mother   . Diabetes Father   . Hypertension Father   . Hyperlipidemia Father   . Hypertension Brother   . Hyperlipidemia Brother   . Hypertension Brother   . Hyperlipidemia Brother     Past Surgical History:  Procedure Laterality Date  . ABDOMINAL HYSTERECTOMY    . BACK SURGERY     Social History   Occupational History  . Not on file  Tobacco Use  . Smoking status: Former Smoker    Packs/day: 1.00    Years: 3.00    Pack years: 3.00    Types: Cigarettes    Quit date: 08/20/2004    Years since quitting: 15.5  . Smokeless tobacco: Never Used  Substance and Sexual Activity  . Alcohol use: No  . Drug use: No  . Sexual activity: Not on file

## 2020-03-17 ENCOUNTER — Telehealth: Payer: Self-pay | Admitting: Radiology

## 2020-03-17 DIAGNOSIS — G8929 Other chronic pain: Secondary | ICD-10-CM

## 2020-03-17 DIAGNOSIS — M7541 Impingement syndrome of right shoulder: Secondary | ICD-10-CM

## 2020-03-17 NOTE — Telephone Encounter (Signed)
Patient states that the shoulder injection she received on 02/17/2020 did not give her any relief. She would like to be set up for a MRI.  Please advise. CB for pt is [phone]

## 2020-03-17 NOTE — Telephone Encounter (Signed)
Ok to schedule right shoulder MRI

## 2020-03-18 ENCOUNTER — Telehealth: Payer: Self-pay

## 2020-03-18 NOTE — Addendum Note (Signed)
Addended by: Rogers Seeds on: 03/18/2020 09:21 AM   Modules accepted: Orders

## 2020-03-18 NOTE — Telephone Encounter (Signed)
Patient order has been entered for MRI of the shoulder at [facility].  She is going out to [place] to see her son soon and would like to see if she can have it done prior to going. She is requesting MRI appt next week at [facility] since she works for the [employer]. I advised I would send you a message to see if that was possible, however, I also explained that we had to wait for insurance authorization and that sometimes could take a while.  She expressed understanding.

## 2020-03-18 NOTE — Telephone Encounter (Signed)
I entered order for MRI.  Left voicemail advising patient I have entered order for MRI to be done at [facility]. Asked her to call [place] office to schedule appt with Dr. Cleophas Dunker for MRI review once MRI is scheduled.

## 2020-03-18 NOTE — Telephone Encounter (Signed)
Patient called returning call

## 2020-03-21 NOTE — Telephone Encounter (Signed)
Pending auth thru [facility]

## 2020-03-29 ENCOUNTER — Other Ambulatory Visit: Payer: Self-pay

## 2020-03-29 ENCOUNTER — Telehealth: Payer: Self-pay | Admitting: Orthopaedic Surgery

## 2020-03-29 MED ORDER — TRAMADOL HCL 50 MG PO TABS: ORAL_TABLET | ORAL | 0 refills | Status: DC

## 2020-03-29 NOTE — Telephone Encounter (Signed)
See referral note, pt has been approved

## 2020-03-29 NOTE — Telephone Encounter (Signed)
Patient called advised she is having her scan 04/05/2020 and asked if she can get something called in for pain. Patient said she can't sleep at night. Patient said she is using everything she can to get the pain under control. The number to contact patient is [phone] or [phone]   Patient uses the drug store in [place]. Patient said she is going out of town tomorrow to see her son.

## 2020-03-29 NOTE — Telephone Encounter (Signed)
Please advise.  Last seen in [place] on 02/17/2020. This was in your dictation below...  Plan: Impingement syndrome right shoulder.  No evidence of adhesive capsulitis.  Will inject the subacromial space with cortisone and monitor response.  Suspect this is just mild rotator cuff tendinitis or bursitis.  Consider MRI scan in the future if problem persists  MRI scheduled for next week.

## 2020-03-29 NOTE — Telephone Encounter (Signed)
Tramadol 50mg #30 1tab po bid prn

## 2020-03-29 NOTE — Telephone Encounter (Signed)
Spoke with patient. She is allergic to Tramadol. Patient's is already getting Hydrocodone from Dr.Stacks for her back pain. She has signed a substance use agreement. I explained to patient that upon realizing that, Dr.Whitfield cannot prescribe her anything for pain or it would cause a breech in her contract. At that point, Dr.Stacks could cut her off of any pain medicine. Patient understands and will be in contact with Dr.Stacks if she needs more pain medicine.

## 2020-04-05 ENCOUNTER — Telehealth: Payer: Self-pay | Admitting: Orthopaedic Surgery

## 2020-04-05 ENCOUNTER — Ambulatory Visit (HOSPITAL_COMMUNITY)
Admission: RE | Admit: 2020-04-05 | Discharge: 2020-04-05 | Disposition: A | Discharge status: Home / Self Care | Payer: Commercial Managed Care - PPO | Source: Ambulatory Visit | Attending: Orthopaedic Surgery | Admitting: Orthopaedic Surgery

## 2020-04-05 ENCOUNTER — Other Ambulatory Visit: Payer: Self-pay

## 2020-04-05 DIAGNOSIS — M25511 Pain in right shoulder: Secondary | ICD-10-CM

## 2020-04-05 DIAGNOSIS — M7541 Impingement syndrome of right shoulder: Secondary | ICD-10-CM | POA: Diagnosis present

## 2020-04-05 DIAGNOSIS — G8929 Other chronic pain: Secondary | ICD-10-CM | POA: Diagnosis present

## 2020-04-05 NOTE — Telephone Encounter (Signed)
Called and spoke with patient. Explained that we will not receive her results until a couple days. Dr.Whitfield is out of the office this week and will return next Tuesday. She is scheduled to see him at 9am the day after Dr.Whitfield returns to the office. She will wait until then to get the results.

## 2020-04-05 NOTE — Telephone Encounter (Signed)
Patient called.   Wanted to know if she could get some insight on her MRI results over the phone. She has an appointment made but is very anxious to know before then   Call back: [phone]

## 2020-04-13 ENCOUNTER — Encounter: Payer: Self-pay | Admitting: Orthopaedic Surgery

## 2020-04-13 ENCOUNTER — Other Ambulatory Visit: Payer: Self-pay

## 2020-04-13 ENCOUNTER — Ambulatory Visit (INDEPENDENT_AMBULATORY_CARE_PROVIDER_SITE_OTHER): Payer: Commercial Managed Care - PPO | Admitting: Orthopaedic Surgery

## 2020-04-13 ENCOUNTER — Ambulatory Visit: Payer: Self-pay

## 2020-04-13 VITALS — Ht 67.0 in | Wt 172.0 lb

## 2020-04-13 DIAGNOSIS — M7541 Impingement syndrome of right shoulder: Secondary | ICD-10-CM

## 2020-04-13 DIAGNOSIS — M542 Cervicalgia: Secondary | ICD-10-CM | POA: Diagnosis not present

## 2020-04-13 MED ORDER — METHYLPREDNISOLONE 4 MG PO TBPK
ORAL_TABLET | ORAL | 0 refills | Status: DC
Start: 2020-04-13 — End: 2020-04-27

## 2020-04-13 NOTE — Progress Notes (Signed)
Office Visit Note   Patient: Kelly Conley           Date of Birth: [birth year]           MRN: [id number] Visit Date: 04/13/2020              Requested by: Mechele Claude, MD [address] [place],  [place] [address] PCP: Mechele Claude, MD   Assessment & Plan: Visit Diagnoses:  1. Impingement syndrome of right shoulder   2. Neck pain     Plan: Kelly Conley had an MRI scan of her right shoulder that revealed minimal degenerative spurring of the acromioclavicular joint and no evidence of rotator cuff tear.  Severe motion artifact limited the specificity.  Long discussion with Kelly Conley regarding the above.  She has minimal impingement testing and empty can testing.  I am not sure that the problem originates from her shoulder.  She did have some discomfort referred to her arm with neck extension but had good reflexes and good strength with normal sensation.  There is certainly is a possibility that some if not most of her pain is referred from her cervical spine.  I think it is worth trying a course of physical therapy and a Medrol Dosepak.  She also relates having increased pain after several hornet stings within the last several weeks. Kelly Conley is enrolled in a pain clinic and will discuss medicine needs with that physician . We will consider MRI scan of her cervical spine if no improvement over the next 4 to 6 weeks  Follow-Up Instructions: No follow-ups on file.   Orders:  Orders Placed This Encounter  Procedures  . XR Cervical Spine 2 or 3 views  . Ambulatory referral to Physical Therapy   Meds ordered this encounter  Medications  . methylPREDNISolone (MEDROL DOSEPAK) 4 MG TBPK tablet    Sig: Take as directed on package.    Dispense:  21 tablet    Refill:  0      Procedures: No procedures performed   Clinical Data: No additional findings.   Subjective: Chief Complaint  Patient presents with  . Right Shoulder - Follow-up    MRI review  Patient presents today  for follow up on her right shoulder. She had an MRI on 04/05/2020. She is still in pain. She takes Ibuprofen during the day, and Hydrocodone at night.  Has been involved in a pain clinic for chronic problem with her lumbar spine.  She still having considerable discomfort in the area of her right shoulder and arm despite the MRI scan results.  She does have some discomfort with the cervical spine and also relates that she has had some referred pain as far distally as her right thumb.  She also recently was stung by several wasps and seems to have had increased pain since that time  HPI  Review of Systems   Objective: Vital Signs: Ht 5\' 7"  (1.702 m)   Wt 172 lb (78 kg)   LMP 11/21/2002   BMI 26.94 kg/m   Physical Exam Constitutional:      Appearance: She is well-developed.  Eyes:     Pupils: Pupils are equal, round, and reactive to light.  Pulmonary:     Effort: Pulmonary effort is normal.  Skin:    General: Skin is warm and dry.  Neurological:     Mental Status: She is alert and oriented to person, place, and time.  Psychiatric:  Behavior: Behavior normal.     Ortho Exam awake alert and oriented x3.  Comfortable sitting.  Minimal stance pain with impingement testing.  Negative empty can and Speed sign.  Good grip and good release.  No grinding or crepitation or localized areas of tenderness about the shoulder.  She did experience some discomfort into her right arm with neck extension but not with neck flexion.  Reflexes were symmetrical.  Normal sensation.  No localized areas of tenderness about the cervical spine.  Good pulses.  No evidence of thoracic outlet  Specialty Comments:  No specialty comments available.  Imaging: XR Cervical Spine 2 or 3 views  Result Date: 04/13/2020 Films of the cervical spine obtained in 2 views.  There is mild straightening of the normal cervical lordosis.  Mild decrease in the disc space height at C5 5 6 with some facet joint changes see 5 6  and C6-7.  No listhesis.  No curvature.  Some mild anterior spurring throughout the cervical spine films are consistent with moderate osteoarthritis    PMFS History: Patient Active Problem List   Diagnosis Date Noted  . Neck pain 04/13/2020  . Impingement syndrome of right shoulder 02/17/2020  . GAD (generalized anxiety disorder) 02/04/2020  . Pain in left elbow 05/27/2019  . Vitamin D deficiency 11/11/2018  . Anxiety 10/22/2016  . Hypothyroidism 10/22/2016  . Low back pain 04/28/2014  . Sleep disorder 04/28/2014  . Menopausal and postmenopausal disorder 04/28/2014  . Hypertriglyceridemia 04/28/2014  . Menopausal problem 08/21/2011   Past Medical History:  Diagnosis Date  . Allergy   . Anemia   . Anxiety   . DDD (degenerative disc disease), lumbar   . Depression   . GERD (gastroesophageal reflux disease)   . Hypothyroidism   . Osteopenia   . Postmenopausal     Family History  Problem Relation Age of Onset  . Hypertension Mother   . Depression Mother   . Diabetes Father   . Hypertension Father   . Hyperlipidemia Father   . Hypertension Brother   . Hyperlipidemia Brother   . Hypertension Brother   . Hyperlipidemia Brother     Past Surgical History:  Procedure Laterality Date  . ABDOMINAL HYSTERECTOMY    . BACK SURGERY     Social History   Occupational History  . Not on file  Tobacco Use  . Smoking status: Former Smoker    Packs/day: 1.00    Years: 3.00    Pack years: 3.00    Types: Cigarettes    Quit date: 08/20/2004    Years since quitting: 15.6  . Smokeless tobacco: Never Used  Substance and Sexual Activity  . Alcohol use: No  . Drug use: No  . Sexual activity: Not on file

## 2020-04-15 ENCOUNTER — Ambulatory Visit (HOSPITAL_COMMUNITY): Payer: Commercial Managed Care - PPO

## 2020-04-22 ENCOUNTER — Other Ambulatory Visit: Payer: Self-pay | Admitting: Family Medicine

## 2020-04-27 ENCOUNTER — Ambulatory Visit (INDEPENDENT_AMBULATORY_CARE_PROVIDER_SITE_OTHER): Payer: Commercial Managed Care - PPO | Admitting: Family Medicine

## 2020-04-27 ENCOUNTER — Encounter: Payer: Self-pay | Admitting: Family Medicine

## 2020-04-27 ENCOUNTER — Other Ambulatory Visit: Payer: Self-pay

## 2020-04-27 DIAGNOSIS — M47816 Spondylosis without myelopathy or radiculopathy, lumbar region: Secondary | ICD-10-CM | POA: Diagnosis not present

## 2020-04-27 DIAGNOSIS — M5136 Other intervertebral disc degeneration, lumbar region: Secondary | ICD-10-CM

## 2020-04-27 DIAGNOSIS — E781 Pure hyperglyceridemia: Secondary | ICD-10-CM

## 2020-04-27 DIAGNOSIS — M545 Low back pain: Secondary | ICD-10-CM | POA: Diagnosis not present

## 2020-04-27 DIAGNOSIS — G8929 Other chronic pain: Secondary | ICD-10-CM

## 2020-04-27 DIAGNOSIS — N959 Unspecified menopausal and perimenopausal disorder: Secondary | ICD-10-CM

## 2020-04-27 MED ORDER — GABAPENTIN 300 MG PO CAPS: 900.0000 mg | ORAL_CAPSULE | Freq: Every evening | ORAL | 11 refills | Status: DC | PRN

## 2020-04-27 MED ORDER — HYDROCODONE-ACETAMINOPHEN 5-325 MG PO TABS: 1.0000 | ORAL_TABLET | Freq: Every day | ORAL | 0 refills | Status: AC | PRN

## 2020-04-27 NOTE — Progress Notes (Signed)
Subjective:  Patient ID: Kelly Conley, female    DOB: 09/29/1960  Age: 59 y.o. MRN: [id number]  CC: Follow-up   HPI KAIRI HARSHBARGER presents for 47-month follow-up on her chronic pain caused by degenerative disc disease and lumbar spondylosis. She is having a flare also of the bursitis in her right shoulder for which she is taking some extra hydrocodone for about 4 days. She continues to get barely adequate control with the low back pain with use of the pain medication. Her PDMP shows her narcotic score to be 381 and the sedative score to be 451. This gives her an overdose risk score of 210. Her prescription registry shows appropriate filling of the hydrocodone and alprazolam as prescribed. He is taking 3 lorazepam milligram equivalents daily of the alprazolam. Her morphine milligram equivalent is 5.00.   Depression screen [facility] 2/9 04/27/2020 02/04/2020 08/04/2019  Decreased Interest 0 0 0  Down, Depressed, Hopeless 0 0 1  PHQ - 2 Score 0 0 1  Altered sleeping - - 0  Tired, decreased energy - - 0  Change in appetite - - 0  Feeling bad or failure about yourself  - - 0  Trouble concentrating - - 0  Moving slowly or fidgety/restless - - 0  Suicidal thoughts - - 0  PHQ-9 Score - - 1  Difficult doing work/chores - - Not difficult at all    History Karenann has a past medical history of Allergy, Anemia, Anxiety, DDD (degenerative disc disease), lumbar, Depression, GERD (gastroesophageal reflux disease), Hypothyroidism, Osteopenia, and Postmenopausal.   She has a past surgical history that includes Abdominal hysterectomy and Back surgery.   Her family history includes Depression in her mother; Diabetes in her father; Hyperlipidemia in her brother, brother, and father; Hypertension in her brother, brother, father, and mother.She reports that she quit smoking about 15 years ago. Her smoking use included cigarettes. She has a 3.00 pack-year smoking history. She has never used smokeless tobacco. She  reports that she does not drink alcohol and does not use drugs.    ROS Review of Systems  Constitutional: Negative.   HENT: Negative.   Eyes: Negative for visual disturbance.  Respiratory: Negative for shortness of breath.   Cardiovascular: Negative for chest pain.  Gastrointestinal: Negative for abdominal pain.  Musculoskeletal: Negative for arthralgias.    Objective:  BP 110/75   Pulse 87   Temp 97.7 F (36.5 C) (Temporal)   Ht 5\' 7"  (1.702 m)   Wt 174 lb 9.6 oz (79.2 kg)   LMP 11/21/2002   BMI 27.35 kg/m   BP Readings from Last 3 Encounters:  04/27/20 110/75  02/04/20 108/76  08/04/19 108/75    Wt Readings from Last 3 Encounters:  04/27/20 174 lb 9.6 oz (79.2 kg)  04/13/20 172 lb (78 kg)  02/17/20 172 lb (78 kg)     Physical Exam Constitutional:      General: She is not in acute distress.    Appearance: She is well-developed.  HENT:     Head: Normocephalic and atraumatic.  Eyes:     Conjunctiva/sclera: Conjunctivae normal.     Pupils: Pupils are equal, round, and reactive to light.  Neck:     Thyroid: No thyromegaly.  Cardiovascular:     Rate and Rhythm: Normal rate and regular rhythm.     Heart sounds: Normal heart sounds. No murmur heard.   Pulmonary:     Effort: Pulmonary effort is normal. No respiratory distress.  Breath sounds: Normal breath sounds. No wheezing or rales.  Abdominal:     General: Bowel sounds are normal. There is no distension.     Palpations: Abdomen is soft.     Tenderness: There is no abdominal tenderness.  Musculoskeletal:        General: Normal range of motion.     Cervical back: Normal range of motion and neck supple.  Lymphadenopathy:     Cervical: No cervical adenopathy.  Skin:    General: Skin is warm and dry.  Neurological:     Mental Status: She is alert and oriented to person, place, and time.  Psychiatric:        Behavior: Behavior normal.        Thought Content: Thought content normal.        Judgment:  Judgment normal.       Assessment & Plan:   Debraann was seen today for follow-up.  Diagnoses and all orders for this visit:  Hypertriglyceridemia  Menopausal and postmenopausal disorder -     Discontinue: gabapentin (NEURONTIN) 300 MG capsule; Take 3 capsules (900 mg total) by mouth at bedtime as needed. -     gabapentin (NEURONTIN) 300 MG capsule; Take 3 capsules (900 mg total) by mouth at bedtime as needed.  Chronic low back pain without sciatica, unspecified back pain laterality -     Discontinue: gabapentin (NEURONTIN) 300 MG capsule; Take 3 capsules (900 mg total) by mouth at bedtime as needed. -     HYDROcodone-acetaminophen (NORCO/VICODIN) 5-325 MG tablet; Take 1 tablet by mouth daily as needed for moderate pain or severe pain. -     gabapentin (NEURONTIN) 300 MG capsule; Take 3 capsules (900 mg total) by mouth at bedtime as needed.  Lumbar spondylosis -     HYDROcodone-acetaminophen (NORCO/VICODIN) 5-325 MG tablet; Take 1 tablet by mouth daily as needed for moderate pain or severe pain.  DDD (degenerative disc disease), lumbar -     HYDROcodone-acetaminophen (NORCO/VICODIN) 5-325 MG tablet; Take 1 tablet by mouth daily as needed for moderate pain or severe pain.       I have discontinued Marylu Lund L. Scaife's methylPREDNISolone. I am also having her maintain her fish oil-omega-3 fatty acids, cetirizine, levothyroxine, ezetimibe-simvastatin, Linzess, ALPRAZolam, HYDROcodone-acetaminophen, and gabapentin.  Allergies as of 04/27/2020      Reactions   Codeine Other (See Comments)   Headache   Tramadol    Stomach upset   Sulfa Antibiotics Rash      Medication List       Accurate as of April 27, 2020 11:59 PM. If you have any questions, ask your nurse or doctor.        STOP taking these medications   methylPREDNISolone 4 MG Tbpk tablet Commonly known as: MEDROL DOSEPAK Stopped by: Mechele Claude, MD     TAKE these medications   ALPRAZolam 0.5 MG  tablet Commonly known as: XANAX TAKE 1 TABLET 3 TIMES DAILY AS NEEDED FOR ANXIETY   cetirizine 10 MG tablet Commonly known as: ZYRTEC Take 10 mg by mouth daily.   ezetimibe-simvastatin 10-20 MG tablet Commonly known as: VYTORIN Take 1 tablet by mouth daily.   fish oil-omega-3 fatty acids 1000 MG capsule Take 2 g by mouth daily.   gabapentin 300 MG capsule Commonly known as: NEURONTIN Take 3 capsules (900 mg total) by mouth at bedtime as needed. What changed: how much to take Changed by: Mechele Claude, MD   HYDROcodone-acetaminophen 5-325 MG tablet Commonly known as: NORCO/VICODIN Take  1 tablet by mouth daily as needed for moderate pain or severe pain.   levothyroxine 25 MCG tablet Commonly known as: SYNTHROID Take 25 mcg by mouth daily before breakfast.   Linzess 145 MCG Caps capsule Generic drug: linaclotide        Follow-up: Return in about 3 months (around 07/27/2020).  Mechele Claude, M.D.

## 2020-05-03 ENCOUNTER — Encounter: Payer: Self-pay | Admitting: Family Medicine

## 2020-05-09 ENCOUNTER — Ambulatory Visit: Payer: Commercial Managed Care - PPO | Admitting: Family Medicine

## 2020-05-12 ENCOUNTER — Other Ambulatory Visit: Payer: Self-pay | Admitting: Family Medicine

## 2020-05-12 DIAGNOSIS — Z1231 Encounter for screening mammogram for malignant neoplasm of breast: Secondary | ICD-10-CM

## 2020-05-31 ENCOUNTER — Ambulatory Visit: Payer: Commercial Managed Care - PPO

## 2020-06-08 ENCOUNTER — Ambulatory Visit: Payer: Commercial Managed Care - PPO

## 2020-07-19 ENCOUNTER — Other Ambulatory Visit: Payer: Self-pay

## 2020-07-19 ENCOUNTER — Other Ambulatory Visit: Payer: Self-pay | Admitting: Family Medicine

## 2020-07-19 ENCOUNTER — Ambulatory Visit (INDEPENDENT_AMBULATORY_CARE_PROVIDER_SITE_OTHER): Payer: Commercial Managed Care - PPO | Admitting: Family Medicine

## 2020-07-19 ENCOUNTER — Encounter: Payer: Self-pay | Admitting: Family Medicine

## 2020-07-19 VITALS — BP 130/85 | HR 84 | Temp 97.4°F | Ht 67.0 in | Wt 176.0 lb

## 2020-07-19 DIAGNOSIS — M8949 Other hypertrophic osteoarthropathy, multiple sites: Secondary | ICD-10-CM

## 2020-07-19 DIAGNOSIS — G8929 Other chronic pain: Secondary | ICD-10-CM | POA: Diagnosis not present

## 2020-07-19 DIAGNOSIS — M545 Low back pain, unspecified: Secondary | ICD-10-CM

## 2020-07-19 DIAGNOSIS — F419 Anxiety disorder, unspecified: Secondary | ICD-10-CM | POA: Diagnosis not present

## 2020-07-19 DIAGNOSIS — M159 Polyosteoarthritis, unspecified: Secondary | ICD-10-CM

## 2020-07-19 MED ORDER — DICLOFENAC SODIUM 75 MG PO TBEC: 75.0000 mg | DELAYED_RELEASE_TABLET | Freq: Two times a day (BID) | ORAL | 2 refills | Status: DC

## 2020-07-19 MED ORDER — ALPRAZOLAM 0.5 MG PO TABS: ORAL_TABLET | ORAL | 5 refills | Status: DC

## 2020-07-19 NOTE — Progress Notes (Signed)
Subjective:  Patient ID: Kelly Conley, female    DOB: [birth year]  Age: 59 y.o. MRN: [id number]  CC: Follow-up (3 month)   HPI Kelly Conley presents for concerns about having arthritis pain all over.  She uses hydrocodone occasionally when it is really bad.  She has used Xanax as well for history of anxiety.  Currently no panic issues.  She also is followed for thyroid disease by her endocrinologist.  No concerns with regard to energy currently.  Depression screen [facility] 2/9 07/19/2020 04/27/2020 02/04/2020  Decreased Interest 0 0 0  Down, Depressed, Hopeless 0 0 0  PHQ - 2 Score 0 0 0  Altered sleeping - - -  Tired, decreased energy - - -  Change in appetite - - -  Feeling bad or failure about yourself  - - -  Trouble concentrating - - -  Moving slowly or fidgety/restless - - -  Suicidal thoughts - - -  PHQ-9 Score - - -  Difficult doing work/chores - - -    History Kelly Conley has a past medical history of Allergy, Anemia, Anxiety, DDD (degenerative disc disease), lumbar, Depression, GERD (gastroesophageal reflux disease), Hypothyroidism, Osteopenia, and Postmenopausal.   She has a past surgical history that includes Abdominal hysterectomy and Back surgery.   Her family history includes Depression in her mother; Diabetes in her father; Hyperlipidemia in her brother, brother, and father; Hypertension in her brother, brother, father, and mother.She reports that she quit smoking about 15 years ago. Her smoking use included cigarettes. She has a 3.00 pack-year smoking history. She has never used smokeless tobacco. She reports that she does not drink alcohol and does not use drugs.    ROS Review of Systems  Constitutional: Negative.   HENT: Negative.   Eyes: Negative for visual disturbance.  Respiratory: Negative for shortness of breath.   Cardiovascular: Negative for chest pain.  Gastrointestinal: Negative for abdominal pain.  Musculoskeletal: Negative for arthralgias.     Objective:  BP 130/85   Pulse 84   Temp (!) 97.4 F (36.3 C) (Temporal)   Ht 5\' 7"  (1.702 m)   Wt 176 lb (79.8 kg)   LMP 11/21/2002   BMI 27.57 kg/m   BP Readings from Last 3 Encounters:  07/19/20 130/85  04/27/20 110/75  02/04/20 108/76    Wt Readings from Last 3 Encounters:  07/19/20 176 lb (79.8 kg)  04/27/20 174 lb 9.6 oz (79.2 kg)  04/13/20 172 lb (78 kg)     Physical Exam Constitutional:      General: She is not in acute distress.    Appearance: She is well-developed.  HENT:     Head: Normocephalic and atraumatic.  Eyes:     Conjunctiva/sclera: Conjunctivae normal.     Pupils: Pupils are equal, round, and reactive to light.  Neck:     Thyroid: No thyromegaly.  Cardiovascular:     Rate and Rhythm: Normal rate and regular rhythm.     Heart sounds: Normal heart sounds. No murmur heard.   Pulmonary:     Effort: Pulmonary effort is normal. No respiratory distress.     Breath sounds: Normal breath sounds. No wheezing or rales.  Abdominal:     General: Bowel sounds are normal. There is no distension.     Palpations: Abdomen is soft.     Tenderness: There is no abdominal tenderness.  Musculoskeletal:        General: Normal range of motion.     Cervical back:  Normal range of motion and neck supple.  Lymphadenopathy:     Cervical: No cervical adenopathy.  Skin:    General: Skin is warm and dry.  Neurological:     Mental Status: She is alert and oriented to person, place, and time.  Psychiatric:        Behavior: Behavior normal.        Thought Content: Thought content normal.        Judgment: Judgment normal.       Assessment & Plan:   Kelly Conley was seen today for follow-up.  Diagnoses and all orders for this visit:  Chronic low back pain without sciatica, unspecified back pain laterality -     Ambulatory referral to Pain Clinic  Anxiety -     ALPRAZolam (XANAX) 0.5 MG tablet; TAKE 1 TABLET 3 TIMES DAILY AS NEEDED FOR ANXIETY  Primary osteoarthritis  involving multiple joints -     Ambulatory referral to Pain Clinic  Other orders -     diclofenac (VOLTAREN) 75 MG EC tablet; Take 1 tablet (75 mg total) by mouth 2 (two) times daily. For muscle and  Joint pain       I am having Kelly Conley start on diclofenac. I am also having her maintain her fish oil-omega-3 fatty acids, cetirizine, levothyroxine, Linzess, HYDROcodone-acetaminophen, gabapentin, ezetimibe-simvastatin, and ALPRAZolam.  Allergies as of 07/19/2020      Reactions   Codeine Other (See Comments)   Headache   Tramadol    Stomach upset   Sulfa Antibiotics Rash      Medication List       Accurate as of July 19, 2020 11:59 PM. If you have any questions, ask your nurse or doctor.        ALPRAZolam 0.5 MG tablet Commonly known as: XANAX TAKE 1 TABLET 3 TIMES DAILY AS NEEDED FOR ANXIETY   cetirizine 10 MG tablet Commonly known as: ZYRTEC Take 10 mg by mouth daily.   diclofenac 75 MG EC tablet Commonly known as: VOLTAREN Take 1 tablet (75 mg total) by mouth 2 (two) times daily. For muscle and  Joint pain Started by: Mechele Claude, MD   ezetimibe-simvastatin 10-40 MG tablet Commonly known as: VYTORIN Take 1 tablet by mouth daily. What changed: Another medication with the same name was removed. Continue taking this medication, and follow the directions you see here. Changed by: Mechele Claude, MD   fish oil-omega-3 fatty acids 1000 MG capsule Take 2 g by mouth daily.   gabapentin 300 MG capsule Commonly known as: NEURONTIN Take 3 capsules (900 mg total) by mouth at bedtime as needed.   HYDROcodone-acetaminophen 5-325 MG tablet Commonly known as: NORCO/VICODIN Take 1 tablet by mouth daily as needed for moderate pain or severe pain.   levothyroxine 25 MCG tablet Commonly known as: SYNTHROID Take 25 mcg by mouth daily before breakfast.   Linzess 145 MCG Caps capsule Generic drug: linaclotide        Follow-up: Return in about 6 months  (around 01/16/2021).  Mechele Claude, M.D.

## 2020-07-20 ENCOUNTER — Telehealth: Payer: Self-pay

## 2020-07-25 ENCOUNTER — Encounter: Payer: Self-pay | Admitting: Family Medicine

## 2020-07-28 ENCOUNTER — Ambulatory Visit: Payer: Commercial Managed Care - PPO | Admitting: Family Medicine

## 2020-07-29 ENCOUNTER — Telehealth: Payer: Self-pay

## 2020-07-29 NOTE — Telephone Encounter (Signed)
Pt aware of provider feedback. 

## 2020-07-29 NOTE — Telephone Encounter (Signed)
I sent in 6 months worth of refills on the Xanax when she was here, November 30.

## 2020-08-03 ENCOUNTER — Encounter: Payer: Self-pay | Admitting: Orthopaedic Surgery

## 2020-08-03 ENCOUNTER — Other Ambulatory Visit: Payer: Self-pay

## 2020-08-03 ENCOUNTER — Ambulatory Visit (INDEPENDENT_AMBULATORY_CARE_PROVIDER_SITE_OTHER): Payer: Commercial Managed Care - PPO | Admitting: Orthopaedic Surgery

## 2020-08-03 VITALS — Ht 67.0 in | Wt 176.0 lb

## 2020-08-03 DIAGNOSIS — M542 Cervicalgia: Secondary | ICD-10-CM

## 2020-08-03 DIAGNOSIS — M7541 Impingement syndrome of right shoulder: Secondary | ICD-10-CM

## 2020-08-03 MED ORDER — METHYLPREDNISOLONE 4 MG PO TBPK
ORAL_TABLET | ORAL | 0 refills | Status: DC
Start: 2020-08-03 — End: 2021-06-05

## 2020-08-03 NOTE — Progress Notes (Signed)
Office Visit Note   Patient: Kelly Conley           Date of Birth: Jun 18, 1961           MRN: [id number] Visit Date: 08/03/2020              Requested by: Mechele Claude, MD [address] [place],  [place] [address] PCP: Mechele Claude, MD   Assessment & Plan: Visit Diagnoses:  1. Neck pain   2. Impingement syndrome of right shoulder     Plan: Mrs. Meding has been followed for the problem referable to her right upper extremity.  I was concerned initially that the problem might be referable to her right shoulder.  She had an MRI scan in August that was compromised by severe motion artifact which reduces the sensitivity of the scan.  There was minimal degenerative spurring of the [facility] joint and no evidence of a rotator cuff tear.  She has had a cortisone injection and a course of prednisone without much relief.  She is having a lot of pain in her arm and forearm.  She has had a course of physical therapy with dry needling.  She seems to think some of the numbness and tingling is better but still having some discomfort to the point of compromise.  She has had a chronic history of low back pain and had been on Xanax and hydrocodone.  Her primary care physician discontinued the hydrocodone within the past month and she is only on Xanax and Voltaren.  Based on her exam today I think there is a reasonable chance of the problems referable to her cervical spine.  She has pain referred to her right shoulder and arm with neck extension and to a lesser extent with neck flexion.  Her prior films demonstrated straightening of the normal cervical lordosis.  I will order an MRI scan of the cervical spine and try another course of prednisone.  She will not take any NSAIDs while on the prednisone  Follow-Up Instructions: Return After MRI scan cervical spine.   Orders:  Orders Placed This Encounter  Procedures  . MR Cervical Spine w/o contrast   Meds ordered this encounter  Medications  . methylPREDNISolone  (MEDROL DOSEPAK) 4 MG TBPK tablet    Sig: Take as directed on package    Dispense:  21 tablet    Refill:  0      Procedures: No procedures performed   Clinical Data: No additional findings.   Subjective: Chief Complaint  Patient presents with  . Right Arm - Follow-up  Patient presents today for a follow up on her right arm pain. She said that she still continues to hurt. She has pain that travels down to her wrist. She has been going to physical therapy once weekly. They have been doing some dry needling, but she continues to hurt. She cannot sleep at night. She takes Ibuprofen during the day, and Naproxen at night. She has been applying heat and ice. No numbness or tingling. She is left hand dominant.  Has had chronic pain referable to her lower spine and her primary care physician had her on hydrocodone and Xanax.  The hydrocodone was recently discontinued.  She stays on the Xanax and Voltaren  HPI  Review of Systems   Objective: Vital Signs: Ht 5\' 7"  (1.702 m)   Wt 176 lb (79.8 kg)   LMP 11/21/2002   BMI 27.57 kg/m   Physical Exam Constitutional:  Appearance: She is well-developed and well-nourished.  HENT:     Mouth/Throat:     Mouth: Oropharynx is clear and moist.  Eyes:     Extraocular Movements: EOM normal.     Pupils: Pupils are equal, round, and reactive to light.  Pulmonary:     Effort: Pulmonary effort is normal.  Skin:    General: Skin is warm and dry.  Neurological:     Mental Status: She is alert and oriented to person, place, and time.  Psychiatric:        Mood and Affect: Mood and affect normal.        Behavior: Behavior normal.     Ortho Exam awake alert and oriented x3.  Comfortable sitting impingement testing the right shoulder was negative.  Biceps appears to be intact.  She had multiple areas of tenderness about her shoulder that I thought was out of proportion to what I would have expected given the negative MRI scan and x-rays of her  shoulder.  Good grip and good release.  Reflexes were symmetrical.  Full range of motion without pain of the elbow.  She did have pain referred to her right shoulder and arm and forearm with neck extension and to a lesser extent with neck flexion.  No particular pain with rotation of her neck to the right and to the left.  Motor exam appears to be intact  Specialty Comments:  No specialty comments available.  Imaging: No results found.   PMFS History: Patient Active Problem List   Diagnosis Date Noted  . Neck pain 04/13/2020  . Impingement syndrome of right shoulder 02/17/2020  . GAD (generalized anxiety disorder) 02/04/2020  . Pain in left elbow 05/27/2019  . Vitamin D deficiency 11/11/2018  . Anxiety 10/22/2016  . Hypothyroidism 10/22/2016  . Low back pain 04/28/2014  . Sleep disorder 04/28/2014  . Menopausal and postmenopausal disorder 04/28/2014  . Hypertriglyceridemia 04/28/2014  . Menopausal problem 08/21/2011   Past Medical History:  Diagnosis Date  . Allergy   . Anemia   . Anxiety   . DDD (degenerative disc disease), lumbar   . Depression   . GERD (gastroesophageal reflux disease)   . Hypothyroidism   . Osteopenia   . Postmenopausal     Family History  Problem Relation Age of Onset  . Hypertension Mother   . Depression Mother   . Diabetes Father   . Hypertension Father   . Hyperlipidemia Father   . Hypertension Brother   . Hyperlipidemia Brother   . Hypertension Brother   . Hyperlipidemia Brother     Past Surgical History:  Procedure Laterality Date  . ABDOMINAL HYSTERECTOMY    . BACK SURGERY     Social History   Occupational History  . Not on file  Tobacco Use  . Smoking status: Former Smoker    Packs/day: 1.00    Years: 3.00    Pack years: 3.00    Types: Cigarettes    Quit date: 08/20/2004    Years since quitting: 15.9  . Smokeless tobacco: Never Used  Substance and Sexual Activity  . Alcohol use: No  . Drug use: No  . Sexual activity: Not  on file

## 2020-08-17 ENCOUNTER — Ambulatory Visit (HOSPITAL_COMMUNITY)
Admission: RE | Admit: 2020-08-17 | Discharge: 2020-08-17 | Disposition: A | Discharge status: Home / Self Care | Payer: Commercial Managed Care - PPO | Source: Ambulatory Visit | Attending: Orthopaedic Surgery | Admitting: Orthopaedic Surgery

## 2020-08-17 ENCOUNTER — Other Ambulatory Visit: Payer: Self-pay

## 2020-08-17 DIAGNOSIS — M542 Cervicalgia: Secondary | ICD-10-CM | POA: Insufficient documentation

## 2020-08-18 ENCOUNTER — Ambulatory Visit
Admission: RE | Admit: 2020-08-18 | Discharge: 2020-08-18 | Disposition: A | Discharge status: Home / Self Care | Payer: Commercial Managed Care - PPO | Source: Ambulatory Visit | Attending: Family Medicine | Admitting: Family Medicine

## 2020-08-18 DIAGNOSIS — Z1231 Encounter for screening mammogram for malignant neoplasm of breast: Secondary | ICD-10-CM

## 2020-08-22 ENCOUNTER — Telehealth: Payer: Self-pay | Admitting: Orthopaedic Surgery

## 2020-08-22 NOTE — Telephone Encounter (Signed)
Called with results-would like a referral to neurosurgeon-had prior lumbar surgery by them-use above number or home phone number for contact-thanks

## 2020-08-22 NOTE — Telephone Encounter (Signed)
Patient is asking for Dr. Cleophas Dunker to return her call at [phone].

## 2020-08-23 ENCOUNTER — Other Ambulatory Visit: Payer: Self-pay

## 2020-08-23 DIAGNOSIS — M542 Cervicalgia: Secondary | ICD-10-CM

## 2020-08-23 NOTE — Telephone Encounter (Signed)
Referral has been placed for [facility]. Patient aware.

## 2020-08-29 ENCOUNTER — Ambulatory Visit: Payer: Commercial Managed Care - PPO | Admitting: Family Medicine

## 2020-09-18 IMAGING — US ULTRASOUND ABDOMEN LIMITED
1 series · 14 of 25 positions shown · non-contrast
Comparison: None.

CLINICAL DATA: Right upper quadrant pain

EXAM:
ULTRASOUND ABDOMEN LIMITED RIGHT UPPER QUADRANT

[Series 1: ultrasound abdomen limited · 0.16mm/px · 14 of 40 slices shown]
[im 1/40]
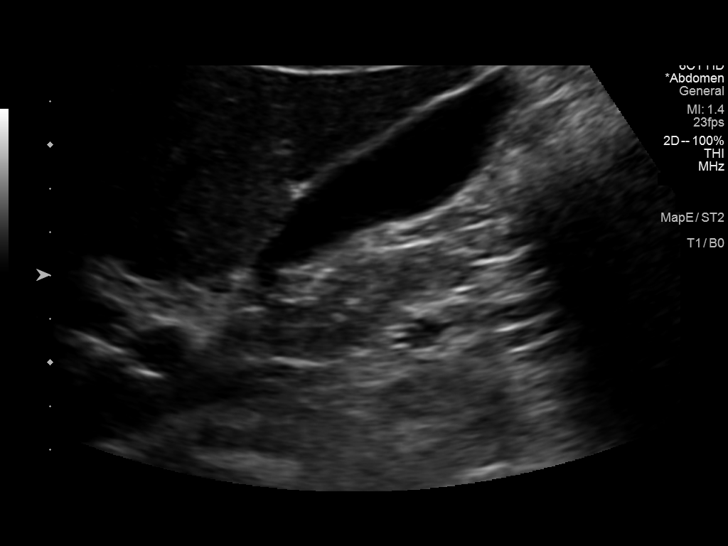
[im 4/40]
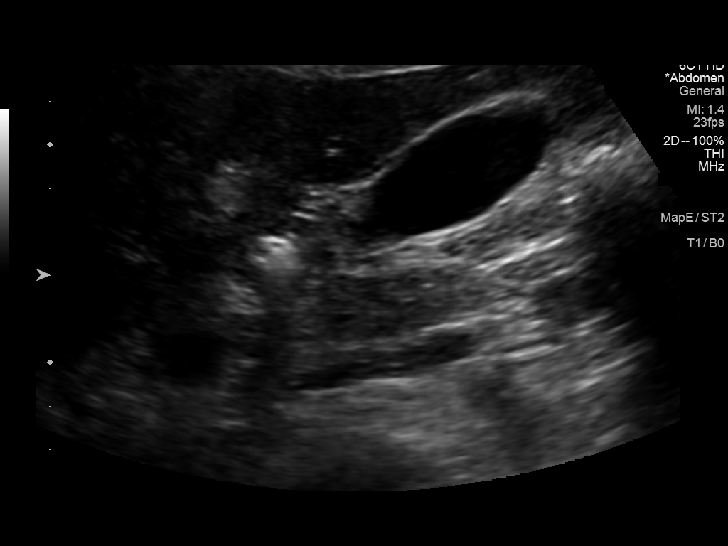
[im 7/40]
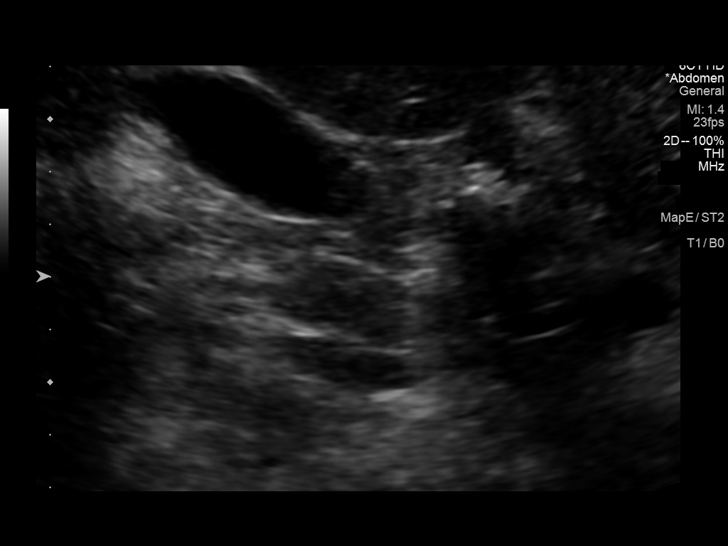
[im 10/40]
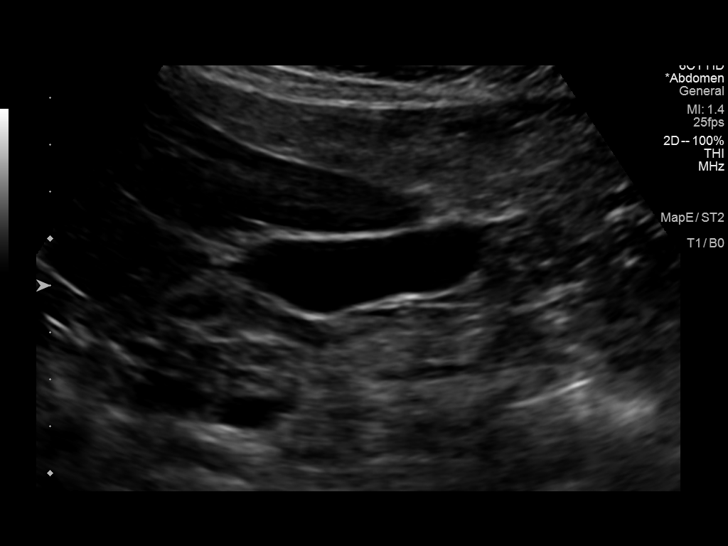
[im 14/40]
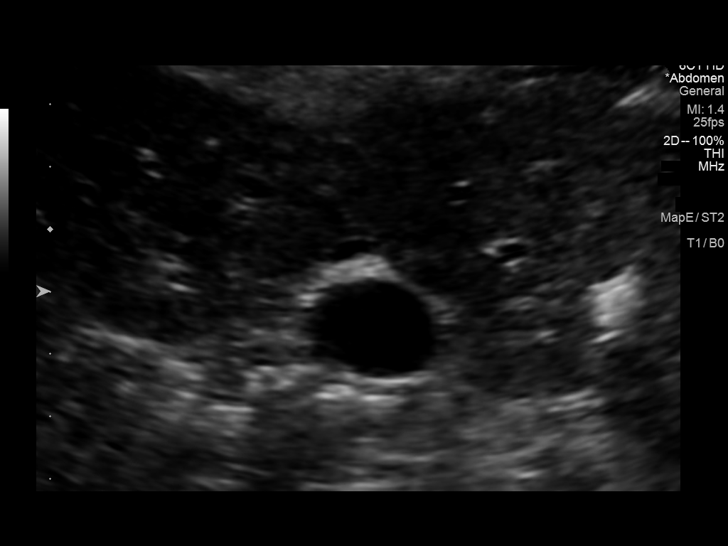
[im 15/40]
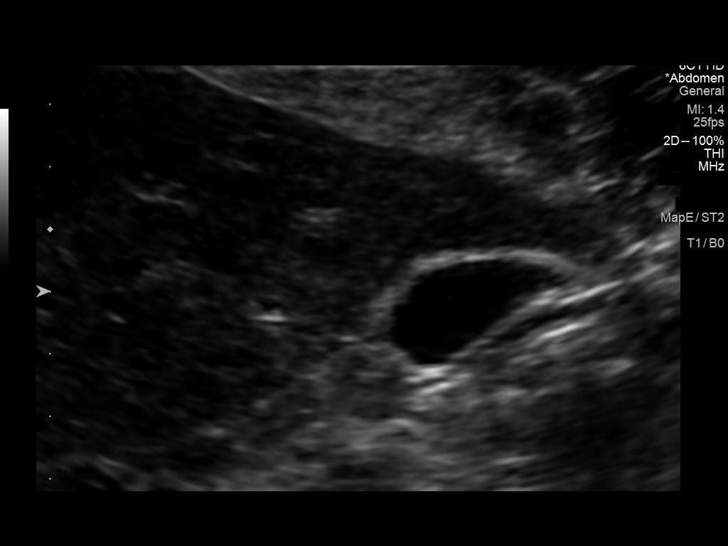
[im 18/40]
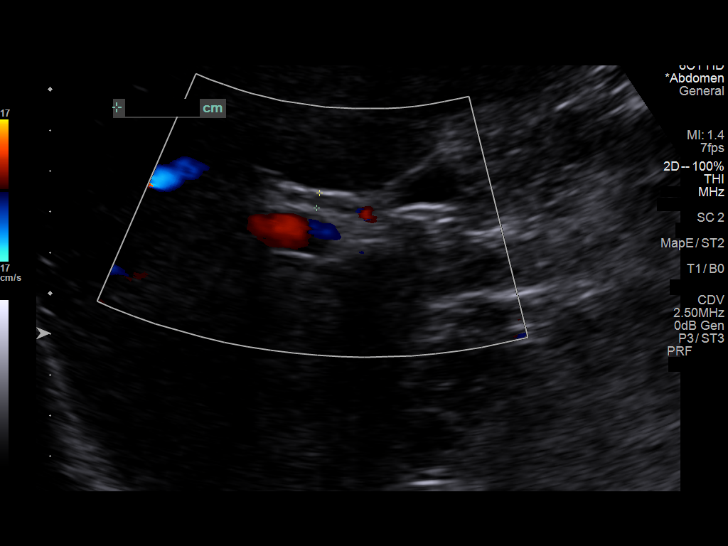
[im 22/40]
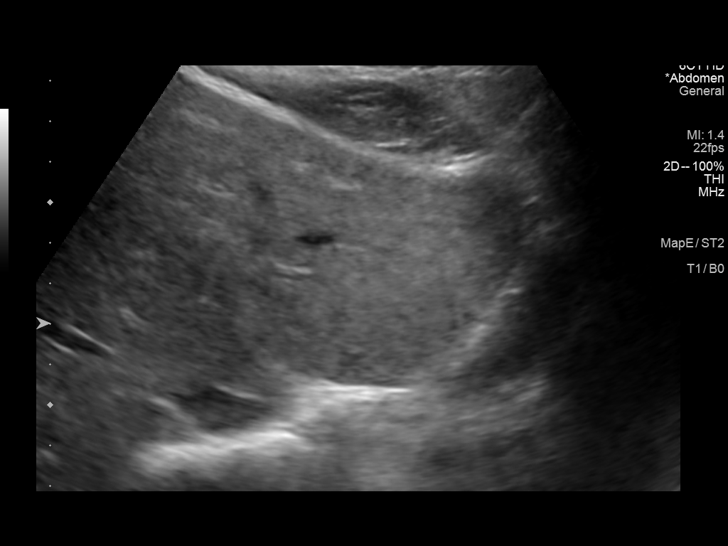
[im 25/40]
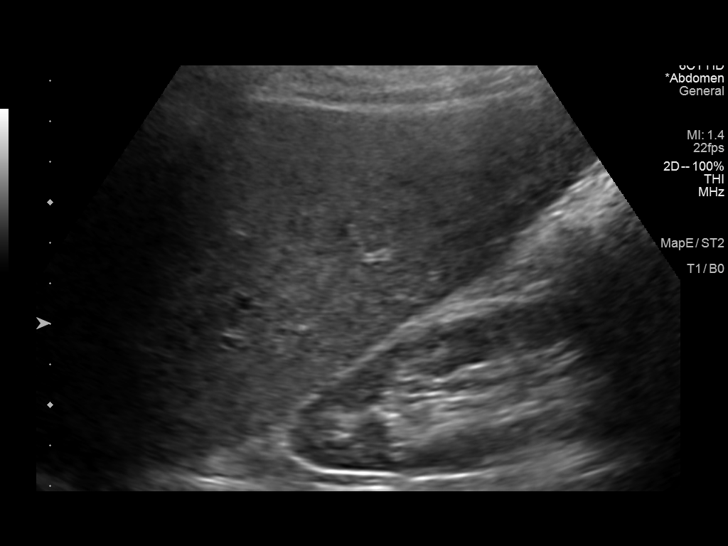
[im 27/40]
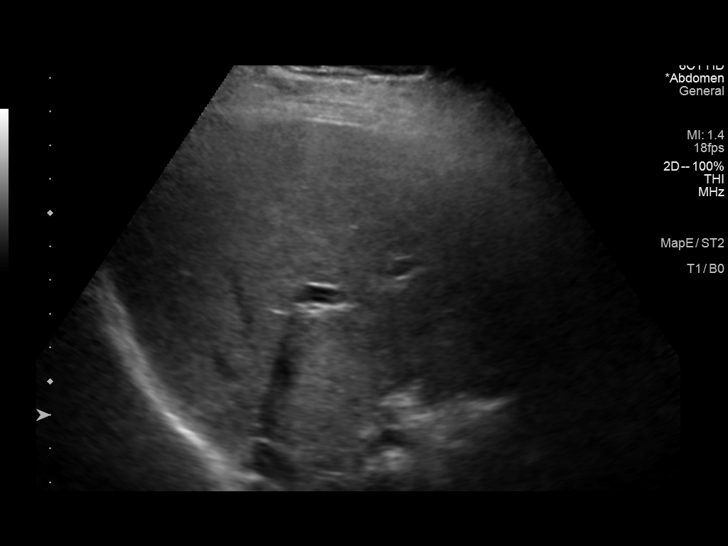
[im 30/40]
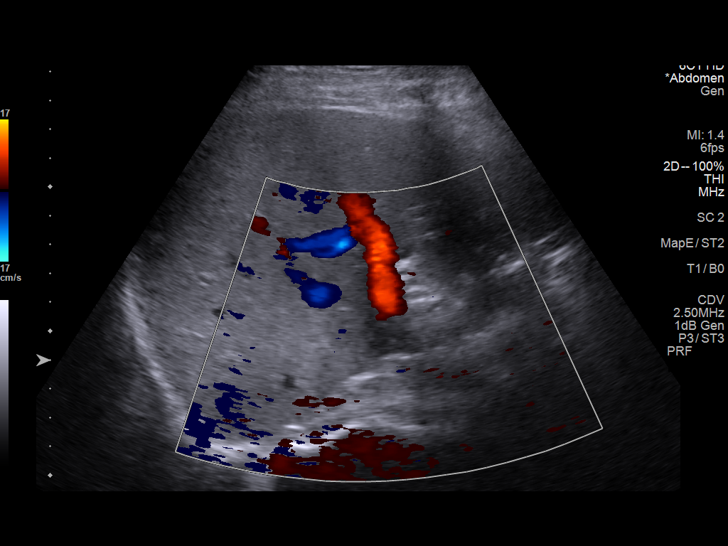
[im 33/40]
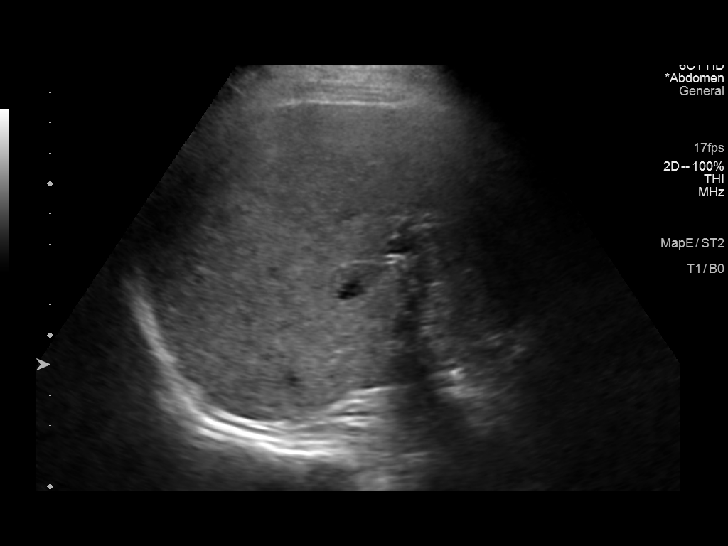
[im 36/40]
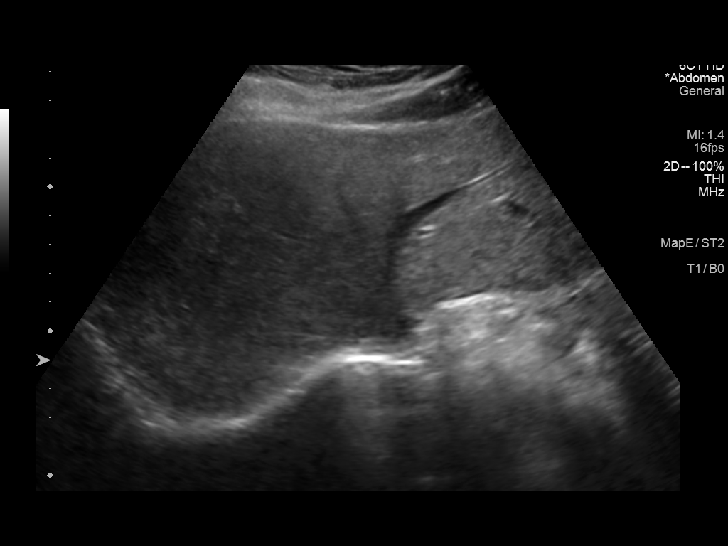
[im 40/40]
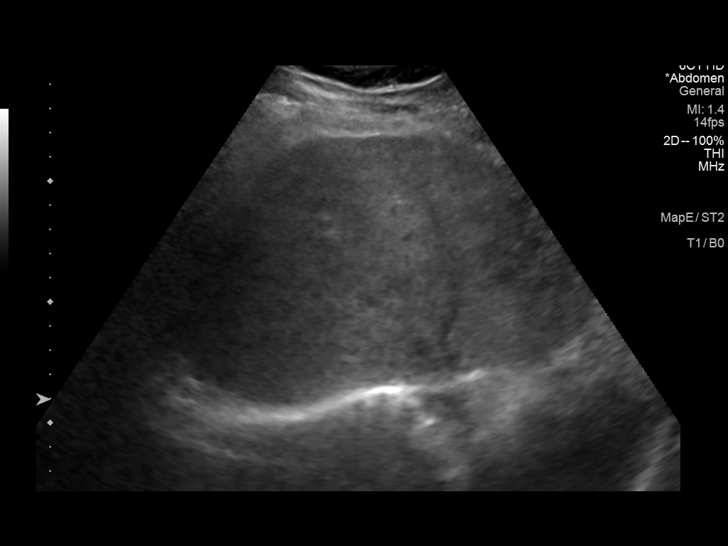

[14 of 25 positions shown; findings below may reference images not displayed]

FINDINGS: Gallbladder:

No gallstones or wall thickening visualized. There is no
pericholecystic fluid. No sonographic Murphy sign noted by
sonographer.

Common bile duct:

Diameter: 4 mm. No intrahepatic or extrahepatic biliary duct
dilatation.

Liver:

No focal lesion identified. Liver echogenicity overall is increased.
Portal vein is patent on color Doppler imaging with normal direction
of blood flow towards the liver.
IMPRESSION: Liver echogenicity overall is somewhat increased. There is felt to
be a degree of hepatic steatosis present. No focal liver lesions are
evident. It must be cautioned that the sensitivity of ultrasound for
detection of focal liver lesions is somewhat diminished in this
circumstance.

Study otherwise unremarkable.

## 2020-10-18 ENCOUNTER — Ambulatory Visit: Payer: Commercial Managed Care - PPO | Admitting: Family Medicine

## 2020-12-13 ENCOUNTER — Ambulatory Visit (INDEPENDENT_AMBULATORY_CARE_PROVIDER_SITE_OTHER): Payer: Commercial Managed Care - PPO | Admitting: Family Medicine

## 2020-12-13 ENCOUNTER — Encounter: Payer: Self-pay | Admitting: Family Medicine

## 2020-12-13 ENCOUNTER — Other Ambulatory Visit: Payer: Self-pay

## 2020-12-13 VITALS — BP 110/87 | HR 89 | Temp 96.8°F | Ht 67.0 in | Wt 176.0 lb

## 2020-12-13 DIAGNOSIS — F419 Anxiety disorder, unspecified: Secondary | ICD-10-CM | POA: Diagnosis not present

## 2020-12-13 DIAGNOSIS — E039 Hypothyroidism, unspecified: Secondary | ICD-10-CM | POA: Diagnosis not present

## 2020-12-13 DIAGNOSIS — E781 Pure hyperglyceridemia: Secondary | ICD-10-CM

## 2020-12-13 MED ORDER — ALPRAZOLAM 0.5 MG PO TABS: ORAL_TABLET | ORAL | 5 refills | Status: DC

## 2020-12-13 MED ORDER — FENOFIBRATE 160 MG PO TABS: 160.0000 mg | ORAL_TABLET | Freq: Every day | ORAL | 3 refills | Status: DC

## 2020-12-13 MED ORDER — ROSUVASTATIN CALCIUM 10 MG PO TABS
10.0000 mg | ORAL_TABLET | Freq: Every day | ORAL | 1 refills | Status: DC
Start: 2020-12-13 — End: 2021-07-12

## 2020-12-15 ENCOUNTER — Encounter: Payer: Self-pay | Admitting: Family Medicine

## 2020-12-15 NOTE — Progress Notes (Signed)
Subjective:  Patient ID: Kelly Conley, female    DOB: [birth year]  Age: 60 y.o. MRN: [id number]  CC: Medical Management of Chronic Issues   HPI Kelly Conley presents for follow-up of elevated cholesterol. Doing well without complaints on current medication. Denies side effects of statin including myalgia and arthralgia and nausea. Also in today for liver function testing. Currently no chest pain, shortness of breath or other cardiovascular related symptoms noted.She brought in a copy of recent labs. Chol186 HDL 52, LDL 91 Continues to take alprazolam  TID for her anxiety. She feels she will never be able to discontinue it due to the severity of anxiety and the circumstances that fuel it. PDMP review shows Overdose risk score is 170. Her LME is 2.5. She is a former patient of Prudy Feeler who previously prescribed her anxiety med.   follow-up on  thyroid. The patient has a history of hypothyroidism for many years. It has been stable recently. Pt. denies any change in  voice, loss of hair, heat or cold intolerance. Energy level has been adequate to good. Patient denies constipation and diarrhea. No myxedema. Medication is as noted below. Verified that pt is taking it daily on an empty stomach. Well tolerated. Free T4 is1.20 and TSH 3.320 on 11/28/20 GAD 7 : Generalized Anxiety Score 12/14/2020 02/04/2020 08/04/2019  Nervous, Anxious, on Edge 1 1 1   Control/stop worrying 0 0 1  Worry too much - different things 1 3 1   Trouble relaxing 0 0 0  Restless 0 0 0  Easily annoyed or irritable 0 1 1  Afraid - awful might happen 0 0 0  Total GAD 7 Score 2 5 4   Anxiety Difficulty Not difficult at all Somewhat difficult Not difficult at all    Depression screen [facility] 2/9 12/14/2020 12/13/2020 07/19/2020 04/27/2020 02/04/2020  Decreased Interest 0 0 0 0 0  Down, Depressed, Hopeless 0 0 0 0 0  PHQ - 2 Score 0 0 0 0 0  Altered sleeping 0 - - - -  Tired, decreased energy 0 - - - -  Change in appetite 0 - - - -   Feeling bad or failure about yourself  0 - - - -  Trouble concentrating 0 - - - -  Moving slowly or fidgety/restless 0 - - - -  Suicidal thoughts 0 - - - -  PHQ-9 Score 0 - - - -  Difficult doing work/chores - - - - -     History [name] has a past medical history of Allergy, Anemia, Anxiety, DDD (degenerative disc disease), lumbar, Depression, GERD (gastroesophageal reflux disease), Hypothyroidism, Menopausal and postmenopausal disorder (04/28/2014), Osteopenia, and Postmenopausal.   She has a past surgical history that includes Abdominal hysterectomy and Back surgery.   Her family history includes Depression in her mother; Diabetes in her father; Hyperlipidemia in her brother, brother, and father; Hypertension in her brother, brother, father, and mother.She reports that she quit smoking about 16 years ago. Her smoking use included cigarettes. She has a 3.00 pack-year smoking history. She has never used smokeless tobacco. She reports that she does not drink alcohol and does not use drugs.  Current Outpatient Medications on File Prior to Visit  Medication Sig Dispense Refill  . cetirizine (ZYRTEC) 10 MG tablet Take 10 mg by mouth daily.    . diclofenac (VOLTAREN) 75 MG EC tablet Take 1 tablet (75 mg total) by mouth 2 (two) times daily. For muscle and  Joint pain 60 tablet  2  . fish oil-omega-3 fatty acids 1000 MG capsule Take 2 g by mouth daily.    Marland Kitchen gabapentin (NEURONTIN) 300 MG capsule Take 3 capsules (900 mg total) by mouth at bedtime as needed. 90 capsule 11  . levothyroxine (SYNTHROID, LEVOTHROID) 25 MCG tablet Take 25 mcg by mouth daily before breakfast.   0  . LINZESS 145 MCG CAPS capsule     . methylPREDNISolone (MEDROL DOSEPAK) 4 MG TBPK tablet Take as directed on package 21 tablet 0   No current facility-administered medications on file prior to visit.    ROS Review of Systems  Constitutional: Negative.   HENT: Negative.   Eyes: Negative for visual disturbance.  Respiratory:  Negative for shortness of breath.   Cardiovascular: Negative for chest pain.  Gastrointestinal: Negative for abdominal pain.  Musculoskeletal: Negative for arthralgias.    Objective:  BP 110/87   Pulse 89   Temp (!) 96.8 F (36 C)   Ht 5\' 7"  (1.702 m)   Wt 176 lb (79.8 kg)   LMP 11/21/2002   SpO2 100%   BMI 27.57 kg/m   BP Readings from Last 3 Encounters:  12/13/20 110/87  07/19/20 130/85  04/27/20 110/75    Wt Readings from Last 3 Encounters:  12/13/20 176 lb (79.8 kg)  08/03/20 176 lb (79.8 kg)  07/19/20 176 lb (79.8 kg)     Physical Exam Constitutional:      General: She is not in acute distress.    Appearance: She is well-developed.  HENT:     Head: Normocephalic and atraumatic.  Eyes:     Conjunctiva/sclera: Conjunctivae normal.     Pupils: Pupils are equal, round, and reactive to light.  Neck:     Thyroid: No thyromegaly.  Cardiovascular:     Rate and Rhythm: Normal rate and regular rhythm.     Heart sounds: Normal heart sounds. No murmur heard.   Pulmonary:     Effort: Pulmonary effort is normal. No respiratory distress.     Breath sounds: Normal breath sounds. No wheezing or rales.  Abdominal:     General: Bowel sounds are normal. There is no distension.     Palpations: Abdomen is soft.     Tenderness: There is no abdominal tenderness.  Musculoskeletal:        General: Normal range of motion.     Cervical back: Normal range of motion and neck supple.  Lymphadenopathy:     Cervical: No cervical adenopathy.  Skin:    General: Skin is warm and dry.  Neurological:     Mental Status: She is alert and oriented to person, place, and time.  Psychiatric:        Behavior: Behavior normal.        Thought Content: Thought content normal.        Judgment: Judgment normal.       MM 3D SCREEN BREAST BILATERAL  Result Date: 08/18/2020 CLINICAL DATA:  Screening. EXAM: DIGITAL SCREENING BILATERAL MAMMOGRAM WITH TOMO AND CAD COMPARISON:  Previous  exam(s). ACR Breast Density Category b: There are scattered areas of fibroglandular density. FINDINGS: There are no findings suspicious for malignancy. Images were processed with CAD. IMPRESSION: No mammographic evidence of malignancy. A result letter of this screening mammogram will be mailed directly to the patient. RECOMMENDATION: Screening mammogram in one year. (Code:SM-B-01Y) BI-RADS CATEGORY  1: Negative. Electronically Signed   By: 08/20/2020 M.D.   On: 08/18/2020 13:07    Assessment & Plan:  Rosealynn was seen today for medical management of chronic issues.  Diagnoses and all orders for this visit:  Hypothyroidism, unspecified type  Anxiety -     ALPRAZolam (XANAX) 0.5 MG tablet; TAKE 1 TABLET 3 TIMES DAILY AS NEEDED FOR ANXIETY  Hypertriglyceridemia  Other orders -     rosuvastatin (CRESTOR) 10 MG tablet; Take 1 tablet (10 mg total) by mouth daily. For cholesterol -     fenofibrate 160 MG tablet; Take 1 tablet (160 mg total) by mouth daily. For cholesterol and triglyceride   I have discontinued Marylu Lund L. Fallas's ezetimibe-simvastatin and ezetimibe-simvastatin. I am also having her start on rosuvastatin and fenofibrate. Additionally, I am having her maintain her fish oil-omega-3 fatty acids, cetirizine, levothyroxine, Linzess, gabapentin, diclofenac, methylPREDNISolone, and ALPRAZolam.  Meds ordered this encounter  Medications  . ALPRAZolam (XANAX) 0.5 MG tablet    Sig: TAKE 1 TABLET 3 TIMES DAILY AS NEEDED FOR ANXIETY    Dispense:  90 tablet    Refill:  5  . rosuvastatin (CRESTOR) 10 MG tablet    Sig: Take 1 tablet (10 mg total) by mouth daily. For cholesterol    Dispense:  90 tablet    Refill:  1  . fenofibrate 160 MG tablet    Sig: Take 1 tablet (160 mg total) by mouth daily. For cholesterol and triglyceride    Dispense:  90 tablet    Refill:  3     Follow-up: Return in about 6 months (around 06/14/2021).  Mechele Claude, M.D.

## 2021-01-03 ENCOUNTER — Telehealth: Payer: Self-pay | Admitting: *Deleted

## 2021-01-03 NOTE — Telephone Encounter (Signed)
Pt would like to know about getting Cologuard, she forgot to ask at last visit. She did last colonoscopy about 10 yrs ago, no polyps or any other hx

## 2021-01-03 NOTE — Telephone Encounter (Signed)
Yes, please arrange for this.

## 2021-01-04 ENCOUNTER — Other Ambulatory Visit: Payer: Self-pay | Admitting: *Deleted

## 2021-01-04 DIAGNOSIS — Z1211 Encounter for screening for malignant neoplasm of colon: Secondary | ICD-10-CM

## 2021-01-04 NOTE — Telephone Encounter (Signed)
ORDERED

## 2021-01-13 ENCOUNTER — Telehealth: Payer: Self-pay | Admitting: Family Medicine

## 2021-01-13 NOTE — Telephone Encounter (Signed)
Is this fine??

## 2021-01-13 NOTE — Telephone Encounter (Signed)
Patient asking about cologuard status.  She would like to do this vs. colonscopy

## 2021-01-16 NOTE — Telephone Encounter (Signed)
YEs as long as she has never had a polyp on previous colonoscopy

## 2021-01-17 NOTE — Telephone Encounter (Signed)
Lmtcb.

## 2021-01-27 LAB — COLOGUARD: COLOGUARD: NEGATIVE

## 2021-02-03 LAB — COLOGUARD: Cologuard: NEGATIVE

## 2021-04-21 ENCOUNTER — Telehealth: Payer: Self-pay | Admitting: Family Medicine

## 2021-05-31 ENCOUNTER — Other Ambulatory Visit: Payer: Self-pay | Admitting: Family Medicine

## 2021-05-31 DIAGNOSIS — M545 Low back pain, unspecified: Secondary | ICD-10-CM

## 2021-05-31 DIAGNOSIS — N959 Unspecified menopausal and perimenopausal disorder: Secondary | ICD-10-CM

## 2021-05-31 DIAGNOSIS — G8929 Other chronic pain: Secondary | ICD-10-CM

## 2021-05-31 NOTE — Telephone Encounter (Signed)
Patient has appointment 10/17 

## 2021-06-05 ENCOUNTER — Ambulatory Visit (INDEPENDENT_AMBULATORY_CARE_PROVIDER_SITE_OTHER): Payer: Commercial Managed Care - PPO | Admitting: Family Medicine

## 2021-06-05 ENCOUNTER — Encounter: Payer: Self-pay | Admitting: Family Medicine

## 2021-06-05 ENCOUNTER — Other Ambulatory Visit: Payer: Self-pay

## 2021-06-05 DIAGNOSIS — M545 Low back pain, unspecified: Secondary | ICD-10-CM | POA: Diagnosis not present

## 2021-06-05 DIAGNOSIS — G8929 Other chronic pain: Secondary | ICD-10-CM | POA: Diagnosis not present

## 2021-06-05 DIAGNOSIS — F419 Anxiety disorder, unspecified: Secondary | ICD-10-CM | POA: Diagnosis not present

## 2021-06-05 DIAGNOSIS — N959 Unspecified menopausal and perimenopausal disorder: Secondary | ICD-10-CM | POA: Diagnosis not present

## 2021-06-05 MED ORDER — ALPRAZOLAM 0.5 MG PO TABS: ORAL_TABLET | ORAL | 5 refills | Status: DC

## 2021-06-05 MED ORDER — GABAPENTIN 300 MG PO CAPS: ORAL_CAPSULE | ORAL | 3 refills | Status: DC

## 2021-06-05 NOTE — Progress Notes (Signed)
Subjective:  Patient ID: Kelly Conley, female    DOB: 02-11-1961  Age: 60 y.o. MRN: [id number]  CC: Medical Management of Chronic Issues   HPI Kelly Conley presents for  follow-up on  thyroid. The patient has a history of hypothyroidism for many years. It has been stable recently. Pt. denies any change in  voice, loss of hair, heat or cold intolerance. Energy level has been adequate to good. Patient denies constipation and diarrhea. No myxedema. Medication is as noted below. Verified that pt is taking it daily on an empty stomach. Well tolerated.   in for follow-up of elevated cholesterol. Doing well without complaints on current medication. Reports moderate side effects of statin including myalgia. Currently no chest pain, shortness of breath or other cardiovascular related symptoms noted.   Depression screen [facility] 2/9 06/05/2021 12/14/2020 12/13/2020  Decreased Interest 0 0 0  Down, Depressed, Hopeless 0 0 0  PHQ - 2 Score 0 0 0  Altered sleeping - 0 -  Tired, decreased energy - 0 -  Change in appetite - 0 -  Feeling bad or failure about yourself  - 0 -  Trouble concentrating - 0 -  Moving slowly or fidgety/restless - 0 -  Suicidal thoughts - 0 -  PHQ-9 Score - 0 -  Difficult doing work/chores - - -   GAD 7 : Generalized Anxiety Score 12/14/2020 02/04/2020 08/04/2019  Nervous, Anxious, on Edge 1 1 1   Control/stop worrying 0 0 1  Worry too much - different things 1 3 1   Trouble relaxing 0 0 0  Restless 0 0 0  Easily annoyed or irritable 0 1 1  Afraid - awful might happen 0 0 0  Total GAD 7 Score 2 5 4   Anxiety Difficulty Not difficult at all Somewhat difficult Not difficult at all      History Kelly Conley has a past medical history of Allergy, Anemia, Anxiety, DDD (degenerative disc disease), lumbar, Depression, GERD (gastroesophageal reflux disease), Hypothyroidism, Menopausal and postmenopausal disorder (04/28/2014), Osteopenia, and Postmenopausal.   She has a past surgical  history that includes Abdominal hysterectomy and Back surgery.   Her family history includes Depression in her mother; Diabetes in her father; Hyperlipidemia in her brother, brother, and father; Hypertension in her brother, brother, father, and mother.She reports that she quit smoking about 16 years ago. Her smoking use included cigarettes. She has a 3.00 pack-year smoking history. She has never used smokeless tobacco. She reports that she does not drink alcohol and does not use drugs.    ROS Review of Systems  Constitutional: Negative.   HENT: Negative.    Eyes:  Negative for visual disturbance.  Respiratory:  Negative for shortness of breath.   Cardiovascular:  Negative for chest pain.  Gastrointestinal:  Negative for abdominal pain.  Musculoskeletal:  Negative for arthralgias.   Objective:  BP 123/87   Pulse 86   Temp 97.6 F (36.4 C) (Temporal)   Ht 5\' 7"  (1.702 m)   Wt 175 lb 6.4 oz (79.6 kg)   LMP 11/21/2002   BMI 27.47 kg/m   BP Readings from Last 3 Encounters:  06/05/21 123/87  12/13/20 110/87  07/19/20 130/85    Wt Readings from Last 3 Encounters:  06/05/21 175 lb 6.4 oz (79.6 kg)  12/13/20 176 lb (79.8 kg)  08/03/20 176 lb (79.8 kg)     Physical Exam Constitutional:      General: She is not in acute distress.    Appearance: She  is well-developed.  Cardiovascular:     Rate and Rhythm: Normal rate and regular rhythm.  Pulmonary:     Breath sounds: Normal breath sounds.  Musculoskeletal:        General: Normal range of motion.  Skin:    General: Skin is warm and dry.  Neurological:     Mental Status: She is alert and oriented to person, place, and time.      Assessment & Plan:   Yolonda was seen today for medical management of chronic issues.  Diagnoses and all orders for this visit:  Anxiety -     ALPRAZolam (XANAX) 0.5 MG tablet; TAKE 1 TABLET 3 TIMES DAILY AS NEEDED FOR ANXIETY  Menopausal and postmenopausal disorder -     gabapentin  (NEURONTIN) 300 MG capsule; TAKE 3 CAPSULES AT BEDTIME AS NEEDED  Chronic low back pain without sciatica, unspecified back pain laterality -     gabapentin (NEURONTIN) 300 MG capsule; TAKE 3 CAPSULES AT BEDTIME AS NEEDED -     ToxASSURE Select 13 (MW), Urine      I have discontinued Marylu Lund L. Mciver's diclofenac and methylPREDNISolone. I am also having her maintain her fish oil-omega-3 fatty acids, cetirizine, levothyroxine, Linzess, rosuvastatin, fenofibrate, HYDROcodone-acetaminophen, ALPRAZolam, and gabapentin.  Allergies as of 06/05/2021       Reactions   Codeine Other (See Comments)   Headache   Tramadol    Stomach upset   Sulfa Antibiotics Rash        Medication List        Accurate as of June 05, 2021  6:47 PM. If you have any questions, ask your nurse or doctor.          STOP taking these medications    diclofenac 75 MG EC tablet Commonly known as: VOLTAREN Stopped by: Mechele Claude, MD   methylPREDNISolone 4 MG Tbpk tablet Commonly known as: MEDROL DOSEPAK Stopped by: Mechele Claude, MD       TAKE these medications    ALPRAZolam 0.5 MG tablet Commonly known as: XANAX TAKE 1 TABLET 3 TIMES DAILY AS NEEDED FOR ANXIETY   cetirizine 10 MG tablet Commonly known as: ZYRTEC Take 10 mg by mouth daily.   fenofibrate 160 MG tablet Take 1 tablet (160 mg total) by mouth daily. For cholesterol and triglyceride   fish oil-omega-3 fatty acids 1000 MG capsule Take 2 g by mouth daily.   gabapentin 300 MG capsule Commonly known as: NEURONTIN TAKE 3 CAPSULES AT BEDTIME AS NEEDED   HYDROcodone-acetaminophen 5-325 MG tablet Commonly known as: NORCO/VICODIN   levothyroxine 25 MCG tablet Commonly known as: SYNTHROID Take 25 mcg by mouth daily before breakfast.   Linzess 145 MCG Caps capsule Generic drug: linaclotide   rosuvastatin 10 MG tablet Commonly known as: Crestor Take 1 tablet (10 mg total) by mouth daily. For cholesterol       CBC, TSH,  T4, Lipid panel, CMP to be performed at her employment. Report to be sent to me.  Follow-up: Return in about 6 months (around 12/04/2021).  Mechele Claude, M.D.

## 2021-06-10 LAB — TOXASSURE SELECT 13 (MW), URINE

## 2021-06-28 ENCOUNTER — Telehealth: Payer: Self-pay | Admitting: Pharmacist

## 2021-06-28 NOTE — Telephone Encounter (Signed)
Linzess samples left up front for patient

## 2021-07-12 ENCOUNTER — Other Ambulatory Visit: Payer: Self-pay | Admitting: Family Medicine

## 2021-07-20 ENCOUNTER — Other Ambulatory Visit: Payer: Self-pay | Admitting: Family Medicine

## 2021-07-20 DIAGNOSIS — Z1231 Encounter for screening mammogram for malignant neoplasm of breast: Secondary | ICD-10-CM

## 2021-08-25 ENCOUNTER — Ambulatory Visit
Admission: RE | Admit: 2021-08-25 | Discharge: 2021-08-25 | Disposition: A | Discharge status: Home / Self Care | Payer: Commercial Managed Care - PPO | Source: Ambulatory Visit | Attending: Family Medicine | Admitting: Family Medicine

## 2021-08-25 DIAGNOSIS — Z1231 Encounter for screening mammogram for malignant neoplasm of breast: Secondary | ICD-10-CM

## 2021-09-05 ENCOUNTER — Other Ambulatory Visit: Payer: Self-pay | Admitting: Family Medicine

## 2021-10-31 ENCOUNTER — Other Ambulatory Visit: Payer: Self-pay | Admitting: Family Medicine

## 2021-10-31 DIAGNOSIS — M545 Low back pain, unspecified: Secondary | ICD-10-CM

## 2021-10-31 DIAGNOSIS — N959 Unspecified menopausal and perimenopausal disorder: Secondary | ICD-10-CM

## 2021-11-25 IMAGING — MR MR SHOULDER*R* W/O CM
8 series · 40 of 40 positions shown · non-contrast
Comparison: None

CLINICAL DATA: Right shoulder pain over the last 4 months

EXAM:
MRI OF THE RIGHT SHOULDER WITHOUT CONTRAST
TECHNIQUE: Multiplanar, multisequence MR imaging of the shoulder was performed.
No intravenous contrast was administered.

[Series 6: PD fat-sat · axial · right · 4.0mm · 0.38mm/px · z∈[-30,+51]mm · 4 of 21 slices shown (1 of 3)]
[im 1/21]
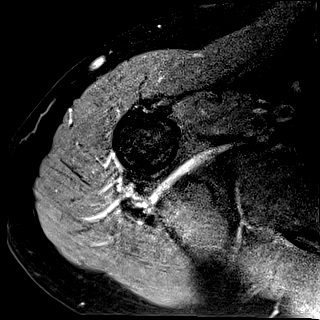
[im 7/21]
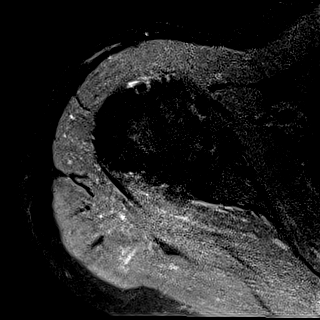
[im 14/21]
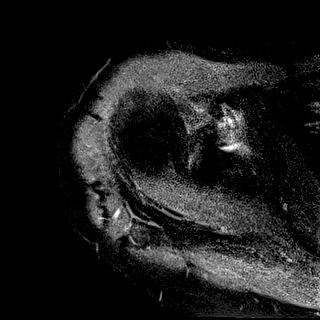
[im 21/21]
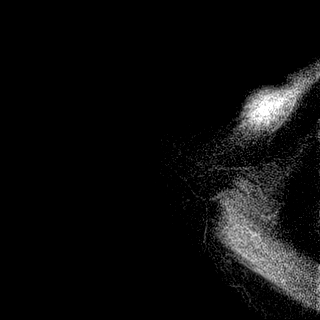

[Series 7: T1 · sagittal · right · 3.0mm · 0.47mm/px · 6 of 31 slices shown]
[im 1/31]
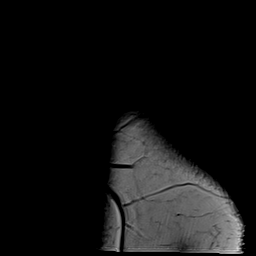
[im 7/31]
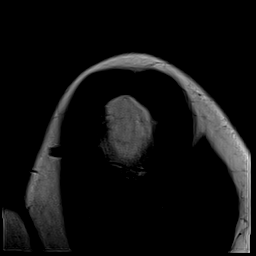
[im 13/31]
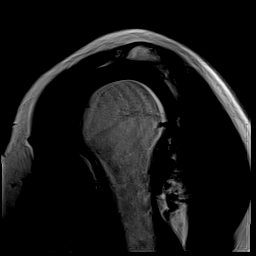
[im 19/31]
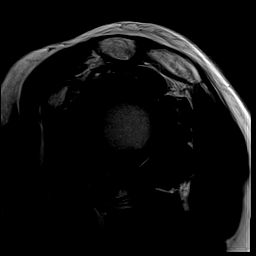
[im 25/31]
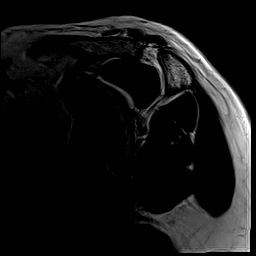
[im 31/31]
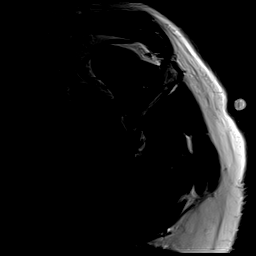

[Series 8: T2 fat-sat · sagittal · right · 3.0mm · 0.47mm/px · 6 of 31 slices shown (1 of 3)]
[im 1/31]
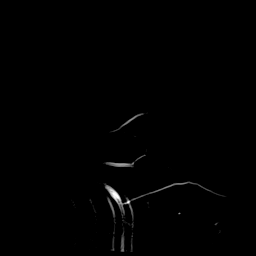
[im 7/31]
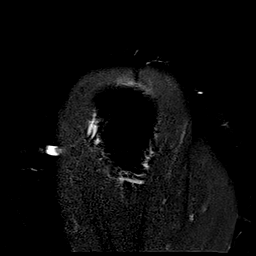
[im 13/31]
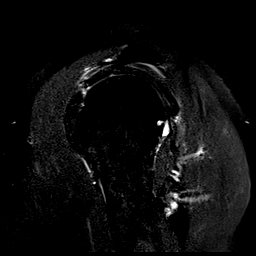
[im 19/31]
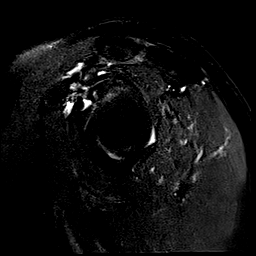
[im 25/31]
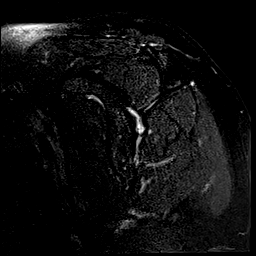
[im 31/31]
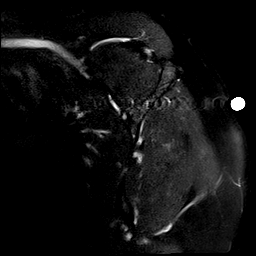

[Series 9: cor · oblique · right · 4.0mm · 0.38mm/px · 4 of 20 slices shown]
[im 1/20]
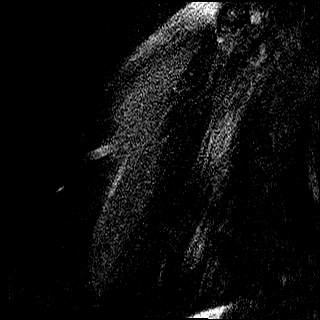
[im 7/20]
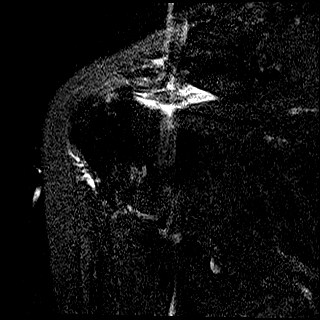
[im 13/20]
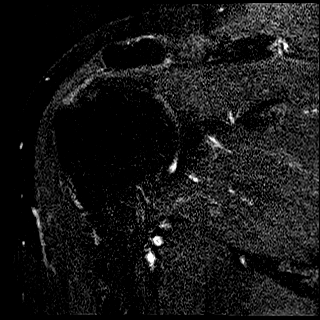
[im 20/20]
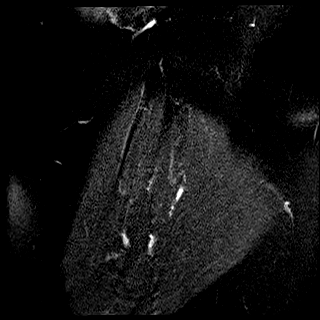

[Series 10: T2 fat-sat · oblique · right · 4.0mm · 0.38mm/px · 5 of 23 slices shown (2 of 3)]
[im 1/23]
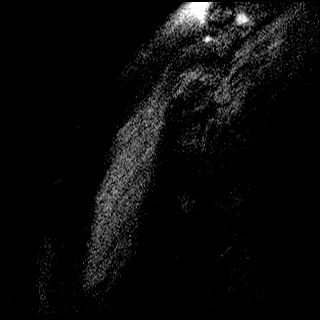
[im 6/23]
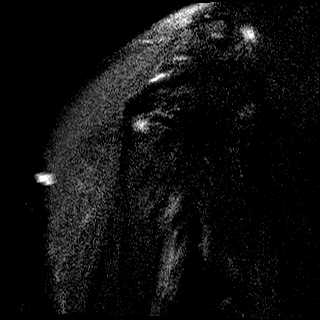
[im 12/23]
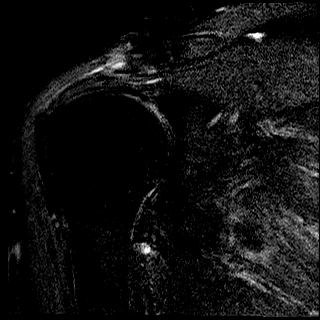
[im 17/23]
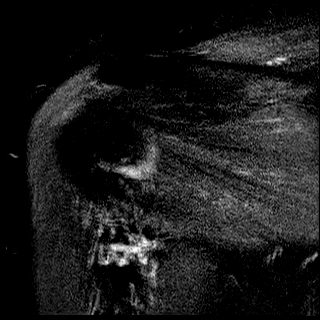
[im 23/23]
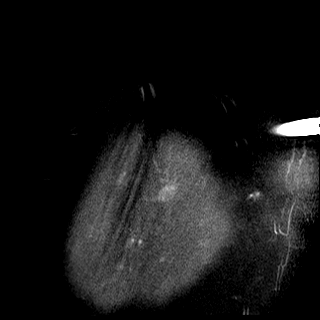

[Series 11: PD fat-sat · oblique · right · 4.0mm · 0.38mm/px · 5 of 23 slices shown (2 of 3)]
[im 1/23]
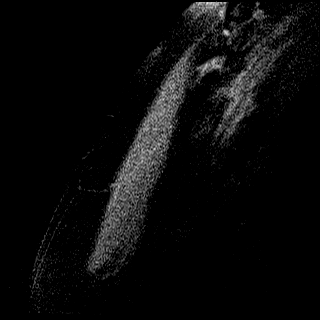
[im 6/23]
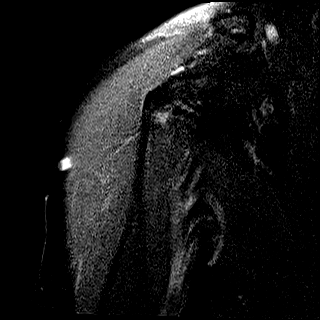
[im 12/23]
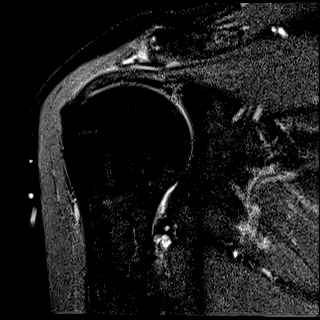
[im 17/23]
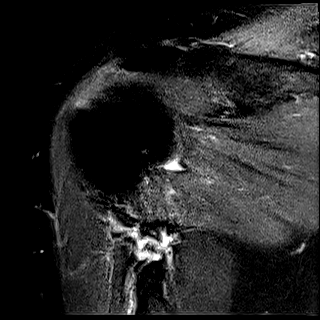
[im 23/23]
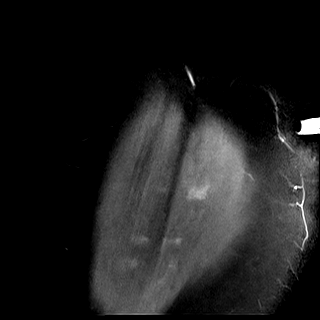

[Series 12: PD fat-sat · axial · right · 4.0mm · 0.55mm/px · z∈[-43,+42]mm · 5 of 22 slices shown (3 of 3)]
[im 1/22]
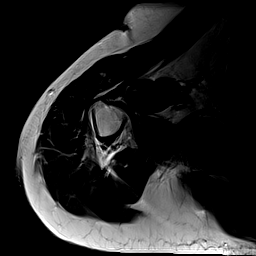
[im 6/22]
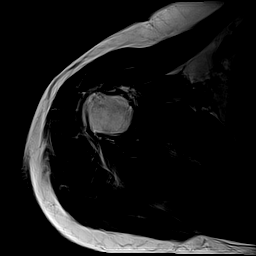
[im 11/22]
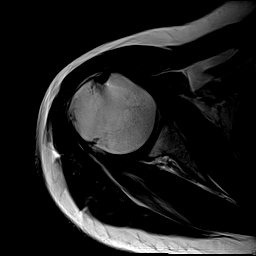
[im 16/22]
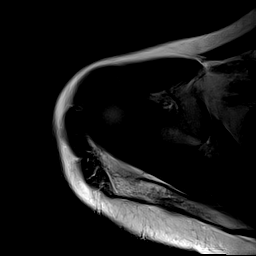
[im 22/22]
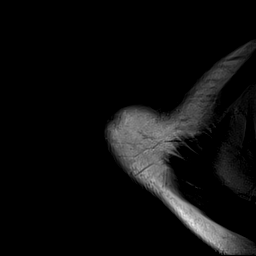

[Series 13: T2 fat-sat · oblique · right · 4.0mm · 0.55mm/px · 5 of 23 slices shown (3 of 3)]
[im 1/23]
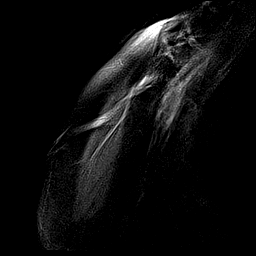
[im 6/23]
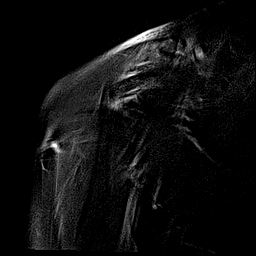
[im 12/23]
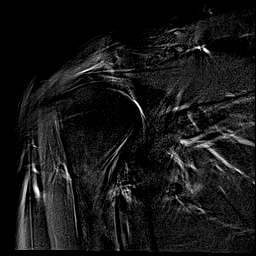
[im 17/23]
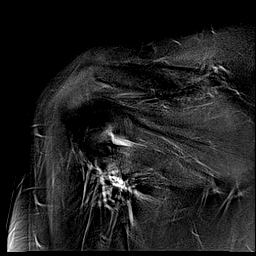
[im 23/23]
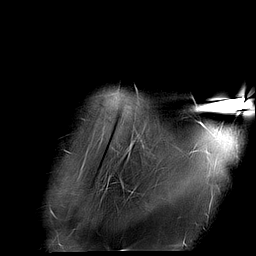

[40 of 40 positions shown; findings below may reference images not displayed]

FINDINGS: Despite efforts by the technologist and patient, severe motion
artifact is present on today's exam and could not be eliminated.
This reduces exam sensitivity and specificity.

Rotator cuff:  Unremarkable

Muscles:  Unremarkable

Biceps long head:  Unremarkable

Acromioclavicular Joint: Minimal spurring. Type II acromion.
Physiologic fluid in the subacromial subdeltoid bursa.

Glenohumeral Joint: Grossly unremarkable

Labrum:  Indeterminate due to severity of motion artifact.

Bones: No significant extra-articular osseous abnormalities
identified.

Other: No supplemental non-categorized findings.
IMPRESSION: 1. Severe motion artifact is present on today's exam and could not
be eliminated. This reduces exam sensitivity and specificity.
2. Minimal degenerative spurring of the acromioclavicular joint.
3. No rotator cuff tear identified.

## 2021-12-04 ENCOUNTER — Ambulatory Visit (INDEPENDENT_AMBULATORY_CARE_PROVIDER_SITE_OTHER): Payer: Commercial Managed Care - PPO | Admitting: Family Medicine

## 2021-12-04 ENCOUNTER — Encounter: Payer: Self-pay | Admitting: Family Medicine

## 2021-12-04 VITALS — BP 114/74 | HR 76 | Temp 97.4°F | Ht 67.0 in | Wt 186.2 lb

## 2021-12-04 DIAGNOSIS — E781 Pure hyperglyceridemia: Secondary | ICD-10-CM | POA: Diagnosis not present

## 2021-12-04 DIAGNOSIS — F419 Anxiety disorder, unspecified: Secondary | ICD-10-CM | POA: Diagnosis not present

## 2021-12-04 DIAGNOSIS — F411 Generalized anxiety disorder: Secondary | ICD-10-CM

## 2021-12-04 DIAGNOSIS — E039 Hypothyroidism, unspecified: Secondary | ICD-10-CM

## 2021-12-04 MED ORDER — SERTRALINE HCL 50 MG PO TABS: 50.0000 mg | ORAL_TABLET | Freq: Every day | ORAL | 5 refills | Status: DC

## 2021-12-04 MED ORDER — ALPRAZOLAM 0.5 MG PO TABS: ORAL_TABLET | ORAL | 5 refills | Status: DC

## 2021-12-04 MED ORDER — FENOFIBRATE 160 MG PO TABS: ORAL_TABLET | ORAL | 3 refills | Status: DC

## 2021-12-04 NOTE — Progress Notes (Signed)
? ?Subjective:  ?Patient ID: Kelly Conley, female    DOB: 12-10-1960  Age: 61 y.o. MRN: [id number] ? ?CC: Medical Management of Chronic Issues ? ? ?HPI ?Kelly Conley presents for cholesterol follow up. Using Tricor. Rosuvastatin caused cramps even q 5 day. Tolerating the tricor. Will have blood work at her job next month. Declines to have it here.  ? ?Constantly worrying about her husband. Denies depression. Wants to try zoloft. USes xanax TID. Staying anxious. Also getting hydrocodone 60 every three months approx. From her pain provider.  ? ?Dr. Chalmers Cater managing thyroid. Denies sx.  ? ? ? ?  12/04/2021  ?  4:14 PM 12/04/2021  ?  4:06 PM 06/05/2021  ?  4:34 PM  ?Depression screen PHQ 2/9  ?Decreased Interest 0 0 0  ?Down, Depressed, Hopeless 1 0 0  ?PHQ - 2 Score 1 0 0  ?Altered sleeping 0    ?Tired, decreased energy 0    ?Change in appetite 0    ?Feeling bad or failure about yourself  0    ?Trouble concentrating 0    ?Moving slowly or fidgety/restless 0    ?Suicidal thoughts 0    ?PHQ-9 Score 1    ?Difficult doing work/chores Not difficult at all    ? ? ?  12/04/2021  ?  4:14 PM 12/14/2020  ?  4:33 PM 02/04/2020  ?  8:28 AM 08/04/2019  ? 10:00 AM  ?GAD 7 : Generalized Anxiety Score  ?Nervous, Anxious, on Edge _0 ?Control/stop worrying 0 0 0 1  ?Worry too much - different things _1 ?Trouble relaxing 0 0 0 0  ?Restless 0 0 0 0  ?Easily annoyed or irritable 0 0 1 1  ?Afraid - awful might happen 0 0 0 0  ?Total GAD 7 Score _2 ?Anxiety Difficulty Not difficult at all Not difficult at all Somewhat difficult Not difficult at all  ? ? ? ?History ?Kelly Conley has a past medical history of Allergy, Anemia, Anxiety, DDD (degenerative disc disease), lumbar, Depression, GERD (gastroesophageal reflux disease), Hypothyroidism, Menopausal and postmenopausal disorder (04/28/2014), Osteopenia, and Postmenopausal.  ? ?She has a past surgical history that includes Abdominal hysterectomy and Back surgery.  ? ?Her family history  includes Depression in her mother; Diabetes in her father; Hyperlipidemia in her brother, brother, and father; Hypertension in her brother, brother, father, and mother.She reports that she quit smoking about 17 years ago. Her smoking use included cigarettes. She has a 3.00 pack-year smoking history. She has never used smokeless tobacco. She reports that she does not drink alcohol and does not use drugs. ? ? ? ?ROS ?Review of Systems  ?Constitutional: Negative.   ?HENT: Negative.    ?Eyes:  Negative for visual disturbance.  ?Respiratory:  Negative for shortness of breath.   ?Cardiovascular:  Negative for chest pain.  ?Gastrointestinal:  Negative for abdominal pain.  ?Musculoskeletal:  Negative for arthralgias.  ? ?Objective:  ?BP 114/74   Pulse 76   Temp (!) 97.4 ?F (36.3 ?C)   Ht _3  (1.702 m)   Wt 186 lb 3.2 oz (84.5 kg)   LMP 11/21/2002   SpO2 94%   BMI 29.16 kg/m?  ? ?BP Readings from Last 3 Encounters:  ?12/04/21 114/74  ?06/05/21 123/87  ?12/13/20 110/87  ? ? ?Wt Readings from Last 3 Encounters:  ?12/04/21 186 lb 3.2 oz (84.5 kg)  ?06/05/21 175 lb 6.4 oz (79.6 kg)  ?  12/13/20 176 lb (79.8 kg)  ? ? ? ?Physical Exam ?Constitutional:   ?   General: She is not in acute distress. ?   Appearance: She is well-developed.  ?Cardiovascular:  ?   Rate and Rhythm: Normal rate and regular rhythm.  ?Pulmonary:  ?   Breath sounds: Normal breath sounds.  ?Musculoskeletal:     ?   General: Normal range of motion.  ?Skin: ?   General: Skin is warm and dry.  ?Neurological:  ?   Mental Status: She is alert and oriented to person, place, and time.  ? ? ? ? ?Assessment & Plan:  ? ?Kelly Conley was seen today for medical management of chronic issues. ? ?Diagnoses and all orders for this visit: ? ?Hypertriglyceridemia ?-     Cancel: CBC with Differential/Platelet; Future ?-     Cancel: CMP14+EGFR; Future ?-     Cancel: Lipid panel; Future ? ?Hypothyroidism, unspecified type ? ?Anxiety ?-     ALPRAZolam (XANAX) 0.5 MG tablet; TAKE 1  TABLET 3 TIMES DAILY AS NEEDED FOR ANXIETY ? ?GAD (generalized anxiety disorder) ? ?Other orders ?-     fenofibrate 160 MG tablet; TAKE ONE (1) TABLET BY MOUTH EVERY DAY ?-     sertraline (ZOLOFT) 50 MG tablet; Take 1 tablet (50 mg total) by mouth at bedtime. For anxiety and depression ? ? ? ? ? ? ?I am having Kelly Conley start on sertraline. I am also having her maintain her fish oil-omega-3 fatty acids, cetirizine, levothyroxine, Linzess, HYDROcodone-acetaminophen, rosuvastatin, gabapentin, ALPRAZolam, and fenofibrate. ? ?Allergies as of 12/04/2021   ? ?   Reactions  ? Codeine Other (See Comments)  ? Headache  ? Tramadol   ? Stomach upset  ? Sulfa Antibiotics Rash  ? ?  ? ?  ?Medication List  ?  ? ?  ? Accurate as of December 04, 2021  5:08 PM. If you have any questions, ask your nurse or doctor.  ?  ?  ? ?  ? ?ALPRAZolam 0.5 MG tablet ?Commonly known as: Duanne Moron ?TAKE 1 TABLET 3 TIMES DAILY AS NEEDED FOR ANXIETY ?  ?cetirizine 10 MG tablet ?Commonly known as: ZYRTEC ?Take 10 mg by mouth daily. ?  ?fenofibrate 160 MG tablet ?TAKE ONE (1) TABLET BY MOUTH EVERY DAY ?What changed:  ?how to take this ?when to take this ?additional instructions ?Changed by: Claretta Fraise, MD ?  ?fish oil-omega-3 fatty acids 1000 MG capsule ?Take 2 g by mouth daily. ?  ?gabapentin 300 MG capsule ?Commonly known as: NEURONTIN ?TAKE 3 CAPSULES AT BEDTIME AS NEEDED ?  ?HYDROcodone-acetaminophen 5-325 MG tablet ?Commonly known as: NORCO/VICODIN ?  ?levothyroxine 25 MCG tablet ?Commonly known as: SYNTHROID ?Take 25 mcg by mouth daily before breakfast. ?  ?Linzess 145 MCG Caps capsule ?Generic drug: linaclotide ?  ?rosuvastatin 10 MG tablet ?Commonly known as: CRESTOR ?TAKE ONE (1) TABLET BY MOUTH EVERY DAY ?  ?sertraline 50 MG tablet ?Commonly known as: ZOLOFT ?Take 1 tablet (50 mg total) by mouth at bedtime. For anxiety and depression ?Started by: Claretta Fraise, MD ?  ? ?  ? ? ? ?Follow-up: Return in about 6 months (around  06/05/2022). ? ?Claretta Fraise, M.D. ?

## 2022-01-16 ENCOUNTER — Other Ambulatory Visit: Payer: Self-pay | Admitting: Family Medicine

## 2022-01-16 DIAGNOSIS — N959 Unspecified menopausal and perimenopausal disorder: Secondary | ICD-10-CM

## 2022-01-16 DIAGNOSIS — M545 Low back pain, unspecified: Secondary | ICD-10-CM

## 2022-03-11 ENCOUNTER — Other Ambulatory Visit: Payer: Self-pay | Admitting: Family Medicine

## 2022-03-11 DIAGNOSIS — G8929 Other chronic pain: Secondary | ICD-10-CM

## 2022-03-11 DIAGNOSIS — N959 Unspecified menopausal and perimenopausal disorder: Secondary | ICD-10-CM

## 2022-05-01 ENCOUNTER — Other Ambulatory Visit: Payer: Self-pay | Admitting: Family Medicine

## 2022-05-28 ENCOUNTER — Other Ambulatory Visit: Payer: Self-pay | Admitting: Family Medicine

## 2022-05-28 DIAGNOSIS — F419 Anxiety disorder, unspecified: Secondary | ICD-10-CM

## 2022-06-04 ENCOUNTER — Encounter: Payer: Self-pay | Admitting: Family Medicine

## 2022-06-04 ENCOUNTER — Ambulatory Visit (INDEPENDENT_AMBULATORY_CARE_PROVIDER_SITE_OTHER): Payer: Commercial Managed Care - PPO | Admitting: Family Medicine

## 2022-06-04 VITALS — BP 105/68 | HR 75 | Temp 97.7°F | Ht 67.0 in | Wt 184.8 lb

## 2022-06-04 DIAGNOSIS — Z79899 Other long term (current) drug therapy: Secondary | ICD-10-CM

## 2022-06-04 DIAGNOSIS — F419 Anxiety disorder, unspecified: Secondary | ICD-10-CM

## 2022-06-04 DIAGNOSIS — L989 Disorder of the skin and subcutaneous tissue, unspecified: Secondary | ICD-10-CM | POA: Diagnosis not present

## 2022-06-04 MED ORDER — VITAMIN D3 125 MCG (5000 UT) PO CAPS: 5000.0000 [IU] | ORAL_CAPSULE | Freq: Every day | ORAL | 3 refills | Status: AC

## 2022-06-04 MED ORDER — ALPRAZOLAM 0.5 MG PO TABS: ORAL_TABLET | ORAL | 5 refills | Status: DC

## 2022-06-04 MED ORDER — ROSUVASTATIN CALCIUM 10 MG PO TABS: ORAL_TABLET | ORAL | 1 refills | Status: DC

## 2022-06-04 NOTE — Progress Notes (Signed)
Subjective:  Patient ID: Kelly Conley, female    DOB: 09/15/1960  Age: 61 y.o. MRN: [id number]  CC: Medical Management of Chronic Issues   HPI Kelly Conley presents for  in for follow-up of elevated cholesterol. Doing well without complaints on current medication. Denies side effects of statin including myalgia and arthralgia and nausea. Currently no chest pain, shortness of breath or other cardiovascular related symptoms noted.  Grieving the loss of her husband of 42 years 3 months ago. [employer]OLoft helping. HAs been on trips with her sister in law. Planning to retire. Has been through denial & anger. Now having depression. Pt. Declined to complete the PHQ, GAD7 surveys. Loss too fresh.      06/04/2022    4:09 PM 12/04/2021    4:14 PM 12/04/2021    4:06 PM  Depression screen PHQ 2/9  Decreased Interest 0 0 0  Down, Depressed, Hopeless 0 1 0  PHQ - 2 Score 0 1 0  Altered sleeping  0   Tired, decreased energy  0   Change in appetite  0   Feeling bad or failure about yourself   0   Trouble concentrating  0   Moving slowly or fidgety/restless  0   Suicidal thoughts  0   PHQ-9 Score  1   Difficult doing work/chores  Not difficult at all     History Kelly Conley has a past medical history of Allergy, Anemia, Anxiety, DDD (degenerative disc disease), lumbar, Depression, GERD (gastroesophageal reflux disease), Hypothyroidism, Menopausal and postmenopausal disorder (04/28/2014), Osteopenia, and Postmenopausal.   She has a past surgical history that includes Abdominal hysterectomy and Back surgery.   Her family history includes Depression in her mother; Diabetes in her father; Hyperlipidemia in her brother, brother, and father; Hypertension in her brother, brother, father, and mother.She reports that she quit smoking about 17 years ago. Her smoking use included cigarettes. She has a 3.00 pack-year smoking history. She has never used smokeless tobacco. She reports that she does not drink alcohol and  does not use drugs.    ROS Review of Systems  Objective:  BP 105/68   Pulse 75   Temp 97.7 F (36.5 C)   Ht 5\' 7"  (1.702 m)   Wt 184 lb 12.8 oz (83.8 kg)   LMP 11/21/2002   SpO2 96%   BMI 28.94 kg/m   BP Readings from Last 3 Encounters:  06/04/22 105/68  12/04/21 114/74  06/05/21 123/87    Wt Readings from Last 3 Encounters:  06/04/22 184 lb 12.8 oz (83.8 kg)  12/04/21 186 lb 3.2 oz (84.5 kg)  06/05/21 175 lb 6.4 oz (79.6 kg)     Physical Exam Constitutional:      General: She is not in acute distress.    Appearance: She is well-developed.  Cardiovascular:     Rate and Rhythm: Normal rate and regular rhythm.  Pulmonary:     Breath sounds: Normal breath sounds.  Musculoskeletal:        General: Normal range of motion.  Skin:    General: Skin is warm and dry.  Neurological:     Mental Status: She is alert and oriented to person, place, and time.       Assessment & Plan:   Kelly Conley was seen today for medical management of chronic issues.  Diagnoses and all orders for this visit:  Controlled substance agreement signed -     Cancel: ToxASSURE Select 13 (MW), Urine -  ToxASSURE Select 13 (MW), Urine  Anxiety -     ALPRAZolam (XANAX) 0.5 MG tablet; TAKE 1 TABLET 3 TIMES DAILY AS NEEDED FOR ANXIETY  Lesion of neck -     Ambulatory referral to Dermatology  Other orders -     rosuvastatin (CRESTOR) 10 MG tablet; TAKE ONE (1) TABLET BY MOUTH EVERY DAY -     Cholecalciferol (VITAMIN D3) 125 MCG (5000 UT) CAPS; Take 1 capsule (5,000 Units total) by mouth daily.       I have discontinued Marcie Bal L. Radin's D3 5000. I am also having her start on Vitamin D3. Additionally, I am having her maintain her fish oil-omega-3 fatty acids, cetirizine, levothyroxine, Linzess, HYDROcodone-acetaminophen, fenofibrate, gabapentin, sertraline, ALPRAZolam, and rosuvastatin.  Allergies as of 06/04/2022       Reactions   Codeine Other (See Comments)   Headache    Tramadol    Stomach upset   Sulfa Antibiotics Rash        Medication List        Accurate as of June 04, 2022  4:59 PM. If you have any questions, ask your nurse or doctor.          ALPRAZolam 0.5 MG tablet Commonly known as: XANAX TAKE 1 TABLET 3 TIMES DAILY AS NEEDED FOR ANXIETY   cetirizine 10 MG tablet Commonly known as: ZYRTEC Take 10 mg by mouth daily.   fenofibrate 160 MG tablet TAKE ONE (1) TABLET BY MOUTH EVERY DAY   fish oil-omega-3 fatty acids 1000 MG capsule Take 2 g by mouth daily.   gabapentin 300 MG capsule Commonly known as: NEURONTIN TAKE 3 CAPSULES AT BEDTIME AS NEEDED   HYDROcodone-acetaminophen 5-325 MG tablet Commonly known as: NORCO/VICODIN   levothyroxine 25 MCG tablet Commonly known as: SYNTHROID Take 25 mcg by mouth daily before breakfast.   Linzess 145 MCG Caps capsule Generic drug: linaclotide   rosuvastatin 10 MG tablet Commonly known as: CRESTOR TAKE ONE (1) TABLET BY MOUTH EVERY DAY What changed:  how to take this when to take this additional instructions Changed by: Claretta Fraise, MD   sertraline 50 MG tablet Commonly known as: ZOLOFT TAKE ONE TABLET BY MOUTH AT BEDTIME   Vitamin D3 125 MCG (5000 UT) Caps Take 1 capsule (5,000 Units total) by mouth daily.         Follow-up: No follow-ups on file.  Claretta Fraise, M.D.

## 2022-06-08 LAB — TOXASSURE SELECT 13 (MW), URINE

## 2022-06-19 ENCOUNTER — Other Ambulatory Visit: Payer: Self-pay | Admitting: Family Medicine

## 2022-06-19 DIAGNOSIS — M545 Low back pain, unspecified: Secondary | ICD-10-CM

## 2022-06-19 DIAGNOSIS — N959 Unspecified menopausal and perimenopausal disorder: Secondary | ICD-10-CM

## 2022-08-01 ENCOUNTER — Other Ambulatory Visit: Payer: Self-pay | Admitting: Family Medicine

## 2022-08-01 DIAGNOSIS — Z1231 Encounter for screening mammogram for malignant neoplasm of breast: Secondary | ICD-10-CM

## 2022-09-25 ENCOUNTER — Ambulatory Visit
Admission: RE | Admit: 2022-09-25 | Discharge: 2022-09-25 | Disposition: A | Discharge status: Home / Self Care | Payer: Commercial Managed Care - PPO | Source: Ambulatory Visit | Attending: Family Medicine | Admitting: Family Medicine

## 2022-09-25 DIAGNOSIS — Z1231 Encounter for screening mammogram for malignant neoplasm of breast: Secondary | ICD-10-CM

## 2022-10-30 ENCOUNTER — Other Ambulatory Visit: Payer: Self-pay | Admitting: Family Medicine

## 2022-11-28 ENCOUNTER — Other Ambulatory Visit: Payer: Self-pay | Admitting: Family Medicine

## 2022-11-28 DIAGNOSIS — F419 Anxiety disorder, unspecified: Secondary | ICD-10-CM

## 2022-12-06 ENCOUNTER — Telehealth: Payer: Self-pay | Admitting: Family Medicine

## 2022-12-06 ENCOUNTER — Encounter: Payer: Self-pay | Admitting: Family Medicine

## 2022-12-06 ENCOUNTER — Ambulatory Visit (INDEPENDENT_AMBULATORY_CARE_PROVIDER_SITE_OTHER): Payer: Commercial Managed Care - PPO | Admitting: Family Medicine

## 2022-12-06 VITALS — BP 105/67 | HR 78 | Temp 97.5°F | Ht 67.0 in | Wt 188.0 lb

## 2022-12-06 DIAGNOSIS — E039 Hypothyroidism, unspecified: Secondary | ICD-10-CM | POA: Diagnosis not present

## 2022-12-06 DIAGNOSIS — M545 Low back pain, unspecified: Secondary | ICD-10-CM | POA: Diagnosis not present

## 2022-12-06 DIAGNOSIS — G8929 Other chronic pain: Secondary | ICD-10-CM

## 2022-12-06 DIAGNOSIS — N959 Unspecified menopausal and perimenopausal disorder: Secondary | ICD-10-CM | POA: Diagnosis not present

## 2022-12-06 DIAGNOSIS — F419 Anxiety disorder, unspecified: Secondary | ICD-10-CM

## 2022-12-06 MED ORDER — SERTRALINE HCL 50 MG PO TABS: 50.0000 mg | ORAL_TABLET | Freq: Every day | ORAL | 3 refills | Status: DC

## 2022-12-06 MED ORDER — PHENTERMINE HCL 8 MG PO TABS: 8.0000 mg | ORAL_TABLET | Freq: Every morning | ORAL | 2 refills | Status: DC

## 2022-12-06 MED ORDER — GABAPENTIN 300 MG PO CAPS: ORAL_CAPSULE | ORAL | 11 refills | Status: DC

## 2022-12-06 MED ORDER — FENOFIBRATE 160 MG PO TABS: ORAL_TABLET | ORAL | 3 refills | Status: DC

## 2022-12-06 MED ORDER — ALPRAZOLAM 0.5 MG PO TABS: ORAL_TABLET | ORAL | 5 refills | Status: DC

## 2022-12-06 MED ORDER — ROSUVASTATIN CALCIUM 10 MG PO TABS: ORAL_TABLET | ORAL | 3 refills | Status: DC

## 2022-12-06 MED ORDER — QSYMIA 3.75-23 MG PO CP24: 1.0000 | ORAL_CAPSULE | Freq: Every morning | ORAL | 2 refills | Status: DC

## 2022-12-06 MED ORDER — TOPIRAMATE 25 MG PO CPSP: 25.0000 mg | ORAL_CAPSULE | Freq: Every day | ORAL | 2 refills | Status: DC

## 2022-12-06 NOTE — Telephone Encounter (Signed)
Pt wants to let [facility] know that her insurance will not pay Phentermine-Topiramate (QSYMIA) 3.75-23 MG CP24 combined. The rx needs to be sent in separate. [facility] the [facility].

## 2022-12-06 NOTE — Telephone Encounter (Signed)
Please let the patient know that I sent their prescription to their pharmacy. Thanks, WS 

## 2022-12-06 NOTE — Progress Notes (Signed)
Subjective:  Patient ID: Kelly Conley, female    DOB: May 03, 1961  Age: 62 y.o. MRN: [id number]  CC: Medical Management of Chronic Issues   HPI Kelly Conley presents for  follow-up on  thyroid. The patient has a history of hypothyroidism for many years. It has been stable recently. Pt. denies any change in  voice, loss of hair, heat or cold intolerance. Energy level has been adequate to good. Patient denies constipation and diarrhea. No myxedema. Medication is as noted below. Verified that pt is taking it daily on an empty stomach. Well tolerated. Frustrated by inability to lose weight. Multiple attempted diets. Had some success with phentermine in past ut full dose caused headaches and felt wired.     12/06/2022    3:52 PM 12/04/2021    4:14 PM 12/14/2020    4:33 PM 02/04/2020    8:28 AM  GAD 7 : Generalized Anxiety Score  Nervous, Anxious, on Edge Control/stop worrying 0 0 0 0  Worry too much - different things Trouble relaxing 0 0 0 0  Restless 0 0 0 0  Easily annoyed or irritable 0 0 0 1  Afraid - awful might happen 0 0 0 0  Total GAD 7 Score Anxiety Difficulty Not difficult at all Not difficult at all Not difficult at all Somewhat difficult      in for follow-up of elevated cholesterol. Doing well without complaints on current medication. Denies side effects of statin including myalgia and arthralgia and nausea. Currently no chest pain, shortness of breath or other cardiovascular related symptoms noted. 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         12/06/2022    3:51 PM 12/06/2022    3:44 PM 06/04/2022    4:09 PM  Depression screen PHQ 2/9  Decreased Interest 0 0 0  Down, Depressed, Hopeless 1 0 0  PHQ - 2 Score 1 0 0  Altered sleeping 0    Tired, decreased energy 0    Change in appetite 0    Feeling bad or failure about yourself  0    Trouble concentrating 0    Moving slowly or fidgety/restless 0    Suicidal thoughts 0    PHQ-9 Score 1     Difficult doing work/chores Not difficult at all      History Kelly Conley has a past medical history of Allergy, Anemia, Anxiety, DDD (degenerative disc disease), lumbar, Depression, GERD (gastroesophageal reflux disease), Hypothyroidism, Menopausal and postmenopausal disorder (04/28/2014), Osteopenia, and Postmenopausal.   Kelly Conley has a past surgical history that includes Abdominal hysterectomy and Back surgery.   Her family history includes Depression in her mother; Diabetes in her father; Hyperlipidemia in her brother, brother, and father; Hypertension in her brother, brother, father, and mother.Kelly Conley reports that Kelly Conley quit smoking about 18 years ago. Her smoking use included cigarettes. Kelly Conley has a 3.00 pack-year smoking history. Kelly Conley has never used smokeless tobacco. Kelly Conley reports that Kelly Conley does not drink alcohol and does not use drugs.    ROS Review of Systems  Constitutional: Negative.   HENT: Negative.    Eyes:  Negative for visual disturbance.  Respiratory:  Negative for shortness of breath.   Cardiovascular:  Negative for chest pain.  Gastrointestinal:  Negative for abdominal pain.  Musculoskeletal:  Negative for arthralgias.    Objective:  BP 105/67   Pulse 78   Temp (!) 97.5 F (36.4  C)   Ht  (1.702 m)   Wt 188 lb (85.3 kg)   LMP 11/21/2002   SpO2 96%   BMI 29.44 kg/m   BP Readings from Last 3 Encounters:  12/06/22 105/67  06/04/22 105/68  12/04/21 114/74    Wt Readings from Last 3 Encounters:  12/06/22 188 lb (85.3 kg)  06/04/22 184 lb 12.8 oz (83.8 kg)  12/04/21 186 lb 3.2 oz (84.5 kg)     Physical Exam Constitutional:      General: Kelly Conley is not in acute distress.    Appearance: Kelly Conley is well-developed.  Cardiovascular:     Rate and Rhythm: Normal rate and regular rhythm.  Pulmonary:     Breath sounds: Normal breath sounds.  Musculoskeletal:        General: Normal range of motion.  Skin:    General: Skin is warm and dry.  Neurological:     Mental Status: Kelly Conley  is alert and oriented to person, place, and time.       Assessment & Plan:   Kelly Conley was seen today for medical management of chronic issues.  Diagnoses and all orders for this visit:  Hypothyroidism, unspecified type  Anxiety -     ALPRAZolam (XANAX) 0.5 MG tablet; TAKE 1 TABLET 3 TIMES DAILY AS NEEDED FOR ANXIETY  Menopausal and postmenopausal disorder -     gabapentin (NEURONTIN) 300 MG capsule; TAKE 3 CAPSULES AT BEDTIME AS NEEDED  Chronic low back pain without sciatica, unspecified back pain laterality -     gabapentin (NEURONTIN) 300 MG capsule; TAKE 3 CAPSULES AT BEDTIME AS NEEDED  Other orders -     fenofibrate 160 MG tablet; TAKE ONE (1) TABLET BY MOUTH EVERY DAY -     rosuvastatin (CRESTOR) 10 MG tablet; TAKE ONE (1) TABLET BY MOUTH EVERY DAY -     sertraline (ZOLOFT) 50 MG tablet; Take 1 tablet (50 mg total) by mouth at bedtime. -     Discontinue: Phentermine-Topiramate (QSYMIA) 3.75-23 MG CP24; Take 1 capsule by mouth in the morning. -     Phentermine HCl 8 MG TABS; Take 1 tablet (8 mg total) by mouth in the morning. -     topiramate (TOPAMAX) 25 MG capsule; Take 1 capsule (25 mg total) by mouth at bedtime.       I have discontinued Kelly Conley's Qsymia. I have also changed her sertraline. Additionally, I am having her start on Phentermine HCl and topiramate. Lastly, I am having her maintain her fish oil-omega-3 fatty acids, cetirizine, levothyroxine, Linzess, HYDROcodone-acetaminophen, Vitamin D3, ALPRAZolam, fenofibrate, gabapentin, and rosuvastatin.  Allergies as of 12/06/2022       Reactions   Codeine Other (See Comments)   Headache   Tramadol    Stomach upset   Sulfa Antibiotics Rash        Medication List        Accurate as of December 06, 2022  4:56 PM. If you have any questions, ask your nurse or doctor.          ALPRAZolam 0.5 MG tablet Commonly known as: XANAX TAKE 1 TABLET 3 TIMES DAILY AS NEEDED FOR ANXIETY   cetirizine 10 MG  tablet Commonly known as: ZYRTEC Take 10 mg by mouth daily.   fenofibrate 160 MG tablet TAKE ONE (1) TABLET BY MOUTH EVERY DAY   fish oil-omega-3 fatty acids 1000 MG capsule Take 2 g by mouth daily.   gabapentin 300 MG capsule Commonly known as: NEURONTIN TAKE 3  CAPSULES AT BEDTIME AS NEEDED   HYDROcodone-acetaminophen 5-325 MG tablet Commonly known as: NORCO/VICODIN   levothyroxine 25 MCG tablet Commonly known as: SYNTHROID Take 25 mcg by mouth daily before breakfast.   Linzess 145 MCG Caps capsule Generic drug: linaclotide   Phentermine HCl 8 MG Tabs Take 1 tablet (8 mg total) by mouth in the morning. Started by: Mechele Claude, MD   rosuvastatin 10 MG tablet Commonly known as: CRESTOR TAKE ONE (1) TABLET BY MOUTH EVERY DAY   sertraline 50 MG tablet Commonly known as: ZOLOFT Take 1 tablet (50 mg total) by mouth at bedtime.   topiramate 25 MG capsule Commonly known as: TOPAMAX Take 1 capsule (25 mg total) by mouth at bedtime. Started by: Mechele Claude, MD   Vitamin D3 125 MCG (5000 UT) Caps Take 1 capsule (5,000 Units total) by mouth daily.         Follow-up: Return in about 3 months (around 03/07/2023).  Mechele Claude, M.D.

## 2022-12-25 ENCOUNTER — Ambulatory Visit: Payer: Commercial Managed Care - PPO

## 2023-02-11 ENCOUNTER — Other Ambulatory Visit (HOSPITAL_COMMUNITY): Payer: Self-pay

## 2023-02-11 ENCOUNTER — Telehealth: Payer: Self-pay

## 2023-02-11 NOTE — Telephone Encounter (Signed)
Patient Advocate Encounter  Prior authorization for Lomaira 8MG  tablets submitted and APPROVED through [employer].  Test billing returns $17.30 copay for 30 day supply.  Key [id number] Effective: 02-11-2023 - 05-12-2023

## 2023-06-03 ENCOUNTER — Other Ambulatory Visit: Payer: Self-pay | Admitting: Family Medicine

## 2023-06-03 DIAGNOSIS — F419 Anxiety disorder, unspecified: Secondary | ICD-10-CM

## 2023-06-06 ENCOUNTER — Ambulatory Visit (INDEPENDENT_AMBULATORY_CARE_PROVIDER_SITE_OTHER): Payer: Commercial Managed Care - PPO | Admitting: Family Medicine

## 2023-06-06 ENCOUNTER — Encounter: Payer: Self-pay | Admitting: Family Medicine

## 2023-06-06 VITALS — BP 113/75 | HR 82 | Temp 97.0°F | Ht 67.0 in | Wt 188.4 lb

## 2023-06-06 DIAGNOSIS — F411 Generalized anxiety disorder: Secondary | ICD-10-CM

## 2023-06-06 DIAGNOSIS — E039 Hypothyroidism, unspecified: Secondary | ICD-10-CM

## 2023-06-06 DIAGNOSIS — Z79899 Other long term (current) drug therapy: Secondary | ICD-10-CM

## 2023-06-06 DIAGNOSIS — F419 Anxiety disorder, unspecified: Secondary | ICD-10-CM | POA: Diagnosis not present

## 2023-06-06 DIAGNOSIS — E559 Vitamin D deficiency, unspecified: Secondary | ICD-10-CM

## 2023-06-06 DIAGNOSIS — E781 Pure hyperglyceridemia: Secondary | ICD-10-CM

## 2023-06-06 MED ORDER — TOPIRAMATE 50 MG PO TABS: 50.0000 mg | ORAL_TABLET | Freq: Every day | ORAL | 2 refills | Status: DC

## 2023-06-06 MED ORDER — ALPRAZOLAM 0.5 MG PO TABS
ORAL_TABLET | ORAL | 5 refills | Status: DC
Start: 2023-07-03 — End: 2023-12-05

## 2023-06-06 NOTE — Progress Notes (Signed)
Subjective:  Patient ID: Kelly Conley, female    DOB: [birth year]  Age: 62 y.o. MRN: [id number]  CC: Medical Management of Chronic Issues   HPI Kelly Conley presents for  follow-up on  thyroid. The patient has a history of hypothyroidism for many years. It has been stable recently. Pt. denies any change in  voice, loss of hair, heat or cold intolerance. Energy level has been adequate to good. Patient denies constipation and diarrhea. No myxedema. Medication is as noted below. Verified that pt is taking it daily on an empty stomach. Well tolerated.  Labs drawn at work yesterday. Will bring tehm in when results are available     06/06/2023    3:52 PM 12/06/2022    3:52 PM 12/04/2021    4:14 PM 12/14/2020    4:33 PM  GAD 7 : Generalized Anxiety Score  Nervous, Anxious, on Edge 1 1 1 1   Control/stop worrying 0 0 0 0  Worry too much - different things 0 1 1 1   Trouble relaxing 0 0 0 0  Restless 0 0 0 0  Easily annoyed or irritable 0 0 0 0  Afraid - awful might happen 0 0 0 0  Total GAD 7 Score 1 2 2 2   Anxiety Difficulty Not difficult at all Not difficult at all Not difficult at all Not difficult at all    Xanax working well for anxiety, but sx rebound if she misses doses.       06/06/2023    3:52 PM 06/06/2023    3:39 PM 12/06/2022    3:51 PM  Depression screen PHQ 2/9  Decreased Interest 0 0 0  Down, Depressed, Hopeless 1 0 1  PHQ - 2 Score 1 0 1  Altered sleeping 0  0  Tired, decreased energy 0  0  Change in appetite 0  0  Feeling bad or failure about yourself  0  0  Trouble concentrating 0  0  Moving slowly or fidgety/restless 0  0  Suicidal thoughts 0  0  PHQ-9 Score 1  1  Difficult doing work/chores Not difficult at all  Not difficult at all    History Kelly Conley has a past medical history of Allergy, Anemia, Anxiety, DDD (degenerative disc disease), lumbar, Depression, GERD (gastroesophageal reflux disease), Hypothyroidism, Menopausal and postmenopausal disorder  (04/28/2014), Osteopenia, and Postmenopausal.   She has a past surgical history that includes Abdominal hysterectomy and Back surgery.   Her family history includes Depression in her mother; Diabetes in her father; Hyperlipidemia in her brother, brother, and father; Hypertension in her brother, brother, father, and mother.She reports that she quit smoking about 18 years ago. Her smoking use included cigarettes. She started smoking about 21 years ago. She has a 3 pack-year smoking history. She has never used smokeless tobacco. She reports that she does not drink alcohol and does not use drugs.    ROS Review of Systems  Constitutional: Negative.   HENT: Negative.    Eyes:  Negative for visual disturbance.  Respiratory:  Negative for shortness of breath.   Cardiovascular:  Negative for chest pain.  Gastrointestinal:  Negative for abdominal pain.  Musculoskeletal:  Positive for myalgias (leg cramps at night). Negative for arthralgias.    Objective:  BP 113/75   Pulse 82   Temp (!) 97 F (36.1 C)   Ht 5\' 7"  (1.702 m)   Wt 188 lb 6.4 oz (85.5 kg)   LMP 11/21/2002   SpO2 96%  BMI 29.51 kg/m   BP Readings from Last 3 Encounters:  06/06/23 113/75  12/06/22 105/67  06/04/22 105/68    Wt Readings from Last 3 Encounters:  06/06/23 188 lb 6.4 oz (85.5 kg)  12/06/22 188 lb (85.3 kg)  06/04/22 184 lb 12.8 oz (83.8 kg)     Physical Exam Constitutional:      General: She is not in acute distress.    Appearance: She is well-developed.  Cardiovascular:     Rate and Rhythm: Normal rate and regular rhythm.  Pulmonary:     Breath sounds: Normal breath sounds.  Musculoskeletal:        General: Normal range of motion.  Skin:    General: Skin is warm and dry.  Neurological:     Mental Status: She is alert and oriented to person, place, and time.       Assessment & Plan:   [name] was seen today for medical management of chronic issues.  Diagnoses and all orders for this  visit:  Controlled substance agreement signed -     ToxASSURE Select 13 (MW), Urine  Anxiety -     ALPRAZolam (XANAX) 0.5 MG tablet; TAKE 1 TABLET 3 TIMES DAILY AS NEEDED FOR ANXIETY  Hypertriglyceridemia  Hypothyroidism, unspecified type  Vitamin D deficiency  GAD (generalized anxiety disorder)  Other orders -     topiramate (TOPAMAX) 50 MG tablet; Take 1 tablet (50 mg total) by mouth daily.       I have discontinued Kelly Conley's Phentermine HCl and topiramate. I am also having her start on topiramate. Additionally, I am having her maintain her fish oil-omega-3 fatty acids, cetirizine, levothyroxine, Linzess, HYDROcodone-acetaminophen, Vitamin D3, fenofibrate, gabapentin, rosuvastatin, sertraline, and ALPRAZolam.  Allergies as of 06/06/2023       Reactions   Codeine Other (See Comments)   Headache   Tramadol    Stomach upset   Sulfa Antibiotics Rash        Medication List        Accurate as of June 06, 2023  4:36 PM. If you have any questions, ask your nurse or doctor.          STOP taking these medications    Phentermine HCl 8 MG Tabs Stopped by: Escher Harr   topiramate 25 MG capsule Commonly known as: TOPAMAX Replaced by: topiramate 50 MG tablet Stopped by: Abhi Moccia       TAKE these medications    ALPRAZolam 0.5 MG tablet Commonly known as: XANAX TAKE 1 TABLET 3 TIMES DAILY AS NEEDED FOR ANXIETY Start taking on: July 03, 2023 What changed: These instructions start on July 03, 2023. If you are unsure what to do until then, ask your doctor or other care provider. Changed by: Dayton Kenley   cetirizine 10 MG tablet Commonly known as: ZYRTEC Take 10 mg by mouth daily.   fenofibrate 160 MG tablet TAKE ONE (1) TABLET BY MOUTH EVERY DAY   fish oil-omega-3 fatty acids 1000 MG capsule Take 2 g by mouth daily.   gabapentin 300 MG capsule Commonly known as: NEURONTIN TAKE 3 CAPSULES AT BEDTIME AS NEEDED    HYDROcodone-acetaminophen 5-325 MG tablet Commonly known as: NORCO/VICODIN   levothyroxine 25 MCG tablet Commonly known as: SYNTHROID Take 25 mcg by mouth daily before breakfast.   Linzess 145 MCG Caps capsule Generic drug: linaclotide   rosuvastatin 10 MG tablet Commonly known as: CRESTOR TAKE ONE (1) TABLET BY MOUTH EVERY DAY   sertraline 50 MG tablet  Commonly known as: ZOLOFT Take 1 tablet (50 mg total) by mouth at bedtime.   topiramate 50 MG tablet Commonly known as: Topamax Take 1 tablet (50 mg total) by mouth daily. Replaces: topiramate 25 MG capsule Started by: Broadus John Priscillia Fouch   Vitamin D3 125 MCG (5000 UT) Caps Take 1 capsule (5,000 Units total) by mouth daily.         Follow-up: Return in about 6 months (around 12/05/2023).  Mechele Claude, M.D.

## 2023-06-08 LAB — LAB REPORT - SCANNED
A1c: 5.9
EGFR: 63

## 2023-06-13 LAB — TOXASSURE SELECT 13 (MW), URINE

## 2023-06-18 ENCOUNTER — Telehealth: Payer: Self-pay | Admitting: *Deleted

## 2023-06-18 NOTE — Telephone Encounter (Signed)
Pt had labs faxed to office Per Dr. Darlyn Read note written on lab work: borderline diabetes - low carb diet. Chol & LDL are high, partially affect by good HDL. LMOVM with above message.

## 2023-07-12 ENCOUNTER — Other Ambulatory Visit: Payer: Self-pay | Admitting: Endocrinology

## 2023-07-12 DIAGNOSIS — E010 Iodine-deficiency related diffuse (endemic) goiter: Secondary | ICD-10-CM

## 2023-07-22 ENCOUNTER — Ambulatory Visit
Admission: RE | Admit: 2023-07-22 | Discharge: 2023-07-22 | Disposition: A | Discharge status: Home / Self Care | Payer: Commercial Managed Care - PPO | Source: Ambulatory Visit | Attending: Endocrinology | Admitting: Endocrinology

## 2023-07-22 DIAGNOSIS — E010 Iodine-deficiency related diffuse (endemic) goiter: Secondary | ICD-10-CM

## 2023-08-30 ENCOUNTER — Other Ambulatory Visit: Payer: Self-pay | Admitting: Family Medicine

## 2023-08-30 DIAGNOSIS — Z1231 Encounter for screening mammogram for malignant neoplasm of breast: Secondary | ICD-10-CM

## 2023-09-27 ENCOUNTER — Ambulatory Visit
Admission: RE | Admit: 2023-09-27 | Discharge: 2023-09-27 | Disposition: A | Discharge status: Home / Self Care | Payer: Commercial Managed Care - PPO | Source: Ambulatory Visit | Attending: Family Medicine | Admitting: Family Medicine

## 2023-09-27 DIAGNOSIS — Z1231 Encounter for screening mammogram for malignant neoplasm of breast: Secondary | ICD-10-CM

## 2023-10-02 ENCOUNTER — Other Ambulatory Visit: Payer: Self-pay | Admitting: Family Medicine

## 2023-10-02 ENCOUNTER — Encounter: Payer: Self-pay | Admitting: Family Medicine

## 2023-10-02 DIAGNOSIS — R928 Other abnormal and inconclusive findings on diagnostic imaging of breast: Secondary | ICD-10-CM

## 2023-10-14 ENCOUNTER — Ambulatory Visit: Payer: Commercial Managed Care - PPO

## 2023-10-14 ENCOUNTER — Ambulatory Visit
Admission: RE | Admit: 2023-10-14 | Discharge: 2023-10-14 | Disposition: A | Discharge status: Home / Self Care | Payer: Commercial Managed Care - PPO | Source: Ambulatory Visit | Attending: Family Medicine | Admitting: Family Medicine

## 2023-10-14 DIAGNOSIS — R928 Other abnormal and inconclusive findings on diagnostic imaging of breast: Secondary | ICD-10-CM

## 2023-12-05 ENCOUNTER — Ambulatory Visit (INDEPENDENT_AMBULATORY_CARE_PROVIDER_SITE_OTHER): Payer: Commercial Managed Care - PPO | Admitting: Family Medicine

## 2023-12-05 ENCOUNTER — Encounter: Payer: Self-pay | Admitting: Family Medicine

## 2023-12-05 VITALS — BP 97/63 | HR 74 | Temp 97.3°F | Ht 67.0 in

## 2023-12-05 DIAGNOSIS — M545 Low back pain, unspecified: Secondary | ICD-10-CM

## 2023-12-05 DIAGNOSIS — G8929 Other chronic pain: Secondary | ICD-10-CM

## 2023-12-05 DIAGNOSIS — N959 Unspecified menopausal and perimenopausal disorder: Secondary | ICD-10-CM

## 2023-12-05 DIAGNOSIS — F419 Anxiety disorder, unspecified: Secondary | ICD-10-CM

## 2023-12-05 DIAGNOSIS — F411 Generalized anxiety disorder: Secondary | ICD-10-CM

## 2023-12-05 DIAGNOSIS — M7541 Impingement syndrome of right shoulder: Secondary | ICD-10-CM

## 2023-12-05 DIAGNOSIS — E039 Hypothyroidism, unspecified: Secondary | ICD-10-CM

## 2023-12-05 MED ORDER — GABAPENTIN 300 MG PO CAPS: ORAL_CAPSULE | ORAL | 11 refills | Status: AC

## 2023-12-05 MED ORDER — TOPIRAMATE 50 MG PO TABS: 50.0000 mg | ORAL_TABLET | Freq: Every day | ORAL | 2 refills | Status: DC

## 2023-12-05 MED ORDER — SERTRALINE HCL 50 MG PO TABS: 50.0000 mg | ORAL_TABLET | Freq: Every day | ORAL | 3 refills | Status: DC

## 2023-12-05 MED ORDER — ALPRAZOLAM 0.5 MG PO TABS: ORAL_TABLET | ORAL | 5 refills | Status: DC

## 2023-12-05 NOTE — Progress Notes (Signed)
 0

## 2023-12-05 NOTE — Progress Notes (Signed)
 Subjective:  Patient ID: Kelly Conley, female    DOB: Nov 21, 1960  Age: 63 y.o. MRN: [id number]  CC: Medical Management of Chronic Issues (Refills pended)   HPI ATHINA FAHEY presents for anxiety control. Using xanax  TID. PDMP review shows appropriate utilization of the patient's controlled medications.   Recently had 2nd look mammogram that turned out okay.    follow-up on  thyroid . The patient has a history of hypothyroidism for many years. It has been stable recently. Pt. denies any change in  voice, loss of hair, heat or cold intolerance. Energy level has been adequate to good. Patient denies constipation and diarrhea. No myxedema. Medication is as noted below. Verified that pt is taking it daily on an empty stomach. Well tolerated.      06/06/2023    3:52 PM 12/06/2022    3:52 PM 12/04/2021    4:14 PM 12/14/2020    4:33 PM  GAD 7 : Generalized Anxiety Score  Nervous, Anxious, on Edge 1 1 1 1   Control/stop worrying 0 0 0 0  Worry too much - different things 0 1 1 1   Trouble relaxing 0 0 0 0  Restless 0 0 0 0  Easily annoyed or irritable 0 0 0 0  Afraid - awful might happen 0 0 0 0  Total GAD 7 Score 1 2 2 2   Anxiety Difficulty Not difficult at all Not difficult at all Not difficult at all Not difficult at all         06/06/2023    3:52 PM 06/06/2023    3:39 PM 12/06/2022    3:51 PM  Depression screen PHQ 2/9  Decreased Interest 0 0 0  Down, Depressed, Hopeless 1 0 1  PHQ - 2 Score 1 0 1  Altered sleeping 0  0  Tired, decreased energy 0  0  Change in appetite 0  0  Feeling bad or failure about yourself  0  0  Trouble concentrating 0  0  Moving slowly or fidgety/restless 0  0  Suicidal thoughts 0  0  PHQ-9 Score 1  1  Difficult doing work/chores Not difficult at all  Not difficult at all    History Evah has a past medical history of Allergy, Anemia, Anxiety, DDD (degenerative disc disease), lumbar, Depression, GERD (gastroesophageal reflux disease),  Hypothyroidism, Menopausal and postmenopausal disorder (04/28/2014), Menopausal problem (08/21/2011), Osteopenia, and Postmenopausal.   She has a past surgical history that includes Abdominal hysterectomy and Back surgery.   Her family history includes Depression in her mother; Diabetes in her father; Hyperlipidemia in her brother, brother, and father; Hypertension in her brother, brother, father, and mother.She reports that she quit smoking about 19 years ago. Her smoking use included cigarettes. She started smoking about 22 years ago. She has a 3 pack-year smoking history. She has never used smokeless tobacco. She reports that she does not drink alcohol and does not use drugs.    ROS Review of Systems  Constitutional: Negative.   HENT: Negative.    Eyes:  Negative for visual disturbance.  Respiratory:  Negative for shortness of breath.   Cardiovascular:  Negative for chest pain.  Gastrointestinal:  Negative for abdominal pain.  Musculoskeletal:  Positive for arthralgias and back pain.    Objective:  BP 97/63   Pulse 74   Temp (!) 97.3 F (36.3 C)   Ht 5\' 7"  (1.702 m)   LMP 11/21/2002   SpO2 97%   BMI 29.51 kg/m  BP Readings from Last 3 Encounters:  12/05/23 97/63  06/06/23 113/75  12/06/22 105/67    Wt Readings from Last 3 Encounters:  06/06/23 188 lb 6.4 oz (85.5 kg)  12/06/22 188 lb (85.3 kg)  06/04/22 184 lb 12.8 oz (83.8 kg)     Physical Exam   Assessment & Plan:  GAD (generalized anxiety disorder)  Menopausal and postmenopausal disorder -     Gabapentin ; TAKE 3 CAPSULES AT BEDTIME AS NEEDED  Dispense: 90 capsule; Refill: 11  Chronic low back pain without sciatica, unspecified back pain laterality -     Gabapentin ; TAKE 3 CAPSULES AT BEDTIME AS NEEDED  Dispense: 90 capsule; Refill: 11  Anxiety -     ALPRAZolam ; TAKE 1 TABLET 3 TIMES DAILY AS NEEDED FOR ANXIETY  Dispense: 90 tablet; Refill: 5  Hypothyroidism, unspecified type  Impingement syndrome of  right shoulder  Other orders -     Topiramate ; Take 1 tablet (50 mg total) by mouth daily.  Dispense: 30 tablet; Refill: 2 -     Sertraline  HCl; Take 1 tablet (50 mg total) by mouth at bedtime.  Dispense: 90 tablet; Refill: 3     Follow-up: Return in about 6 months (around 06/05/2024).  Roise Cleaver, M.D.

## 2023-12-08 ENCOUNTER — Encounter: Payer: Self-pay | Admitting: Family Medicine

## 2023-12-13 ENCOUNTER — Other Ambulatory Visit: Payer: Self-pay | Admitting: Family Medicine

## 2023-12-19 ENCOUNTER — Encounter: Payer: Self-pay | Admitting: *Deleted

## 2024-01-22 ENCOUNTER — Telehealth: Payer: Self-pay

## 2024-01-22 ENCOUNTER — Other Ambulatory Visit: Payer: Self-pay

## 2024-01-22 DIAGNOSIS — Z1211 Encounter for screening for malignant neoplasm of colon: Secondary | ICD-10-CM

## 2024-01-22 NOTE — Telephone Encounter (Signed)
 Copied from CRM [id number]. Topic: General - Other >> Jan 22, 2024 10:49 AM Felizardo Hotter wrote: Reason for CRM: Received call from pt needs Cologuard ordered for 3 year testing. Please call pt at [phone].

## 2024-01-22 NOTE — Telephone Encounter (Signed)
 Cologuard order placed. LS

## 2024-02-03 LAB — COLOGUARD: COLOGUARD: NEGATIVE

## 2024-06-03 ENCOUNTER — Ambulatory Visit: Admitting: Family Medicine

## 2024-06-22 ENCOUNTER — Ambulatory Visit (INDEPENDENT_AMBULATORY_CARE_PROVIDER_SITE_OTHER): Admitting: Family Medicine

## 2024-06-22 ENCOUNTER — Encounter: Payer: Self-pay | Admitting: Family Medicine

## 2024-06-22 VITALS — BP 106/66 | HR 75 | Temp 98.1°F | Ht 67.0 in | Wt 180.0 lb

## 2024-06-22 DIAGNOSIS — E781 Pure hyperglyceridemia: Secondary | ICD-10-CM

## 2024-06-22 DIAGNOSIS — G720 Drug-induced myopathy: Secondary | ICD-10-CM | POA: Insufficient documentation

## 2024-06-22 DIAGNOSIS — F419 Anxiety disorder, unspecified: Secondary | ICD-10-CM

## 2024-06-22 DIAGNOSIS — Z79899 Other long term (current) drug therapy: Secondary | ICD-10-CM | POA: Diagnosis not present

## 2024-06-22 DIAGNOSIS — F411 Generalized anxiety disorder: Secondary | ICD-10-CM

## 2024-06-22 MED ORDER — ALPRAZOLAM 0.5 MG PO TABS: ORAL_TABLET | ORAL | 5 refills | Status: AC

## 2024-06-22 MED ORDER — TOPIRAMATE 50 MG PO TABS: 50.0000 mg | ORAL_TABLET | Freq: Every day | ORAL | 2 refills | Status: AC

## 2024-06-22 MED ORDER — FENOFIBRATE 160 MG PO TABS: 160.0000 mg | ORAL_TABLET | Freq: Every day | ORAL | 1 refills | Status: AC

## 2024-06-22 NOTE — Progress Notes (Signed)
 Subjective:  Patient ID: Kelly Conley, female    DOB: 1960/09/26  Age: 63 y.o. MRN: [id number]  CC: Medical Management of Chronic Issues (Med refill)   HPI  Discussed the use of AI scribe software for clinical note transcription with the patient, who gave verbal consent to proceed.  History of Present Illness Kelly Conley is a 63 year old female who presents for medication management and follow-up for anxiety.  She experiences anxiety, particularly when thinking about her father who has severe dementia. She feels overwhelmed and has had three panic attacks in the past, which she finds distressing. She has been taking Zoloft  (sertraline ) and has gradually reduced her dose from 50 mg daily to 25 mg every other day over the past four months. This reduction has been manageable.  She has lost 15 pounds over the summer, now weighing 181 pounds, and attributes this to increased physical activity, including yard work. She finds this activity provides a distraction from her anxiety.  She has stopped taking Linzess as she no longer experiences constipation. She also discontinued rosuvastatin  due to leg cramps and is currently managing her cholesterol with omega-3 fatty acids and fenofibrate  160 mg. She reports that her lab results have shown some improvement.  She continues to take topiramate , which helps prevent migraines that occur after receiving injections. She also takes hydrocodone , managed by another provider.  She is scheduled to see her endocrinologist for thyroid  management at the end of the month. She had a mammogram and physical exam recently. A knot found last year was evaluated with an ultrasound and is being monitored.          06/22/2024    3:19 PM 06/06/2023    3:52 PM 06/06/2023    3:39 PM  Depression screen PHQ 2/9  Decreased Interest 0 0 0  Down, Depressed, Hopeless 1 1 0  PHQ - 2 Score 1 1 0  Altered sleeping 0 0   Tired, decreased energy 0 0   Change in  appetite 0 0   Feeling bad or failure about yourself  0 0   Trouble concentrating 0 0   Moving slowly or fidgety/restless 0 0   Suicidal thoughts 0 0   PHQ-9 Score 1 1   Difficult doing work/chores Not difficult at all Not difficult at all       06/22/2024    3:19 PM 06/06/2023    3:52 PM 12/06/2022    3:52 PM 12/04/2021    4:14 PM  GAD 7 : Generalized Anxiety Score  Nervous, Anxious, on Edge 1 1 1 1   Control/stop worrying 0 0 0 0  Worry too much - different things 1 0 1 1  Trouble relaxing 0 0 0 0  Restless 0 0 0 0  Easily annoyed or irritable 0 0 0 0  Afraid - awful might happen 0 0 0 0  Total GAD 7 Score 2 1 2 2   Anxiety Difficulty Not difficult at all Not difficult at all Not difficult at all Not difficult at all     History Kelly Conley has a past medical history of Allergy, Anemia, Anxiety, DDD (degenerative disc disease), lumbar, Depression, GERD (gastroesophageal reflux disease), Hypothyroidism, Menopausal and postmenopausal disorder (04/28/2014), Menopausal problem (08/21/2011), Osteopenia, and Postmenopausal.   She has a past surgical history that includes Abdominal hysterectomy and Back surgery.   Her family history includes Depression in her mother; Diabetes in her father; Hyperlipidemia in her brother, brother, and father; Hypertension in her  brother, brother, father, and mother.She reports that she quit smoking about 19 years ago. Her smoking use included cigarettes. She started smoking about 22 years ago. She has a 3 pack-year smoking history. She has never used smokeless tobacco. She reports that she does not drink alcohol and does not use drugs.    ROS Review of Systems  Constitutional: Negative.   HENT:  Negative for congestion.   Eyes:  Negative for visual disturbance.  Respiratory:  Negative for shortness of breath.   Cardiovascular:  Negative for chest pain.  Gastrointestinal:  Negative for abdominal pain, constipation, diarrhea, nausea and vomiting.   Genitourinary:  Negative for difficulty urinating.  Musculoskeletal:  Positive for myalgias. Negative for arthralgias.  Neurological:  Negative for headaches.  Psychiatric/Behavioral:  Negative for sleep disturbance. The patient is nervous/anxious.     Objective:  BP 106/66   Pulse 75   Temp 98.1 F (36.7 C)   Ht 5' 7 (1.702 m)   Wt 180 lb (81.6 kg)   LMP 11/21/2002   SpO2 97%   BMI 28.19 kg/m   BP Readings from Last 3 Encounters:  06/22/24 106/66  12/05/23 97/63  06/06/23 113/75    Wt Readings from Last 3 Encounters:  06/22/24 180 lb (81.6 kg)  06/06/23 188 lb 6.4 oz (85.5 kg)  12/06/22 188 lb (85.3 kg)     Physical Exam Physical Exam MEASUREMENTS: Weight- 181. GENERAL: Alert, cooperative, well developed, no acute distress. HEENT: Normocephalic, normal oropharynx, moist mucous membranes. NECK: No cervical adenopathy. CHEST: Clear to auscultation bilaterally, no wheezes, rhonchi, or crackles. CARDIOVASCULAR: Normal heart rate and rhythm, S1 and S2 normal without murmurs. ABDOMEN: Soft, non-tender, non-distended, without organomegaly, normal bowel sounds. EXTREMITIES: No cyanosis or edema. NEUROLOGICAL: Cranial nerves grossly intact, moves all extremities without gross motor or sensory deficit.   Assessment & Plan:  Controlled substance agreement signed -     ToxASSURE Select 13 (MW), Urine  Anxiety -     ALPRAZolam ; TAKE 1 TABLET 3 TIMES DAILY AS NEEDED FOR ANXIETY  Dispense: 90 tablet; Refill: 5  Other orders -     Topiramate ; Take 1 tablet (50 mg total) by mouth daily.  Dispense: 30 tablet; Refill: 2 -     Fenofibrate ; Take 1 tablet (160 mg total) by mouth daily.  Dispense: 90 tablet; Refill: 1    Assessment and Plan Assessment & Plan Generalized anxiety disorder and depression   She experiences anxiety, particularly related to her father's dementia, and has a history of three panic attacks. Currently, she is tapering off sertraline , taking 25 mg every  other day for four months, which is subtherapeutic. Consider discontinuing sertraline  or extending to every third day if preferred.  Migraine   She experiences migraines, particularly post-injection, and uses topiramate  for prevention. She has refilled her prescription for the upcoming injection. Continue topiramate  for migraine prevention.  Hyperlipidemia   She previously discontinued rosuvastatin  due to leg cramps and currently manages cholesterol with fenofibrate  and omega-3 fatty acid fish oil. She reports dietary changes with increased salads and fruits but occasional potato chip consumption. Emphasize dietary management to reduce cardiovascular risk. Continue fenofibrate  and omega-3 fatty acid fish oil. Advise on dietary modifications to reduce fat and salt intake.  Hypothyroidism   She is under endocrinologist Dr. Augusta care for thyroid  management with an upcoming appointment at [facility] end. A previous neck ultrasound unrelated to thyroid  or goiter was normal. Follow up with endocrinologist, Dr. Ballen, for thyroid  management.  Follow-up: No follow-ups on file.  Butler Der, M.D.

## 2024-06-25 LAB — TOXASSURE SELECT 13 (MW), URINE

## 2024-09-18 ENCOUNTER — Other Ambulatory Visit: Payer: Self-pay | Admitting: Family Medicine

## 2024-09-18 DIAGNOSIS — Z1231 Encounter for screening mammogram for malignant neoplasm of breast: Secondary | ICD-10-CM

## 2024-10-02 ENCOUNTER — Ambulatory Visit

## 2024-10-09 ENCOUNTER — Ambulatory Visit: Admitting: Cardiology

## 2024-12-21 ENCOUNTER — Ambulatory Visit: Admitting: Family Medicine
